# Patient Record
Sex: Male | Born: 1955 | Race: White | Hispanic: No | Marital: Married | Smoking: Never smoker
Health system: Southern US, Community
[De-identification: ages and names within clinical notes are randomized; demographics above are authoritative.]

## PROBLEM LIST (undated history)

## (undated) DIAGNOSIS — I10 Essential (primary) hypertension: Secondary | ICD-10-CM

## (undated) DIAGNOSIS — E119 Type 2 diabetes mellitus without complications: Secondary | ICD-10-CM

## (undated) HISTORY — DX: Type 2 diabetes mellitus without complications: E11.9

## (undated) HISTORY — DX: Essential (primary) hypertension: I10

---

## 2013-03-21 ENCOUNTER — Ambulatory Visit (INDEPENDENT_AMBULATORY_CARE_PROVIDER_SITE_OTHER): Payer: Managed Care, Other (non HMO)

## 2013-03-21 VITALS — BP 145/82 | HR 86 | Resp 18

## 2013-03-21 DIAGNOSIS — M722 Plantar fascial fibromatosis: Secondary | ICD-10-CM

## 2013-03-21 DIAGNOSIS — I739 Peripheral vascular disease, unspecified: Secondary | ICD-10-CM

## 2013-03-21 DIAGNOSIS — M79609 Pain in unspecified limb: Secondary | ICD-10-CM

## 2013-03-21 DIAGNOSIS — E1142 Type 2 diabetes mellitus with diabetic polyneuropathy: Secondary | ICD-10-CM

## 2013-03-21 DIAGNOSIS — E1140 Type 2 diabetes mellitus with diabetic neuropathy, unspecified: Secondary | ICD-10-CM

## 2013-03-21 DIAGNOSIS — E1149 Type 2 diabetes mellitus with other diabetic neurological complication: Secondary | ICD-10-CM

## 2013-03-21 MED ORDER — MELOXICAM 15 MG PO TABS: 15.0000 mg | ORAL_TABLET | Freq: Every day | ORAL | Status: DC
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
## 2013-03-21 NOTE — Patient Instructions (Signed)
Plantar Fasciitis Plantar fasciitis is a common condition that causes foot pain. It is soreness (inflammation) of the band of tough fibrous tissue on the bottom of the foot that runs from the heel bone (calcaneus) to the ball of the foot. The cause of this soreness may be from excessive standing, poor fitting shoes, running on hard surfaces, being overweight, having an abnormal walk, or overuse (this is common in runners) of the painful foot or feet. It is also common in aerobic exercise dancers and ballet dancers. SYMPTOMS  Most people with plantar fasciitis complain of:  Severe pain in the morning on the bottom of their foot especially when taking the first steps out of bed. This pain recedes after a few minutes of walking.  Severe pain is experienced also during walking following a long period of inactivity.  Pain is worse when walking barefoot or up stairs DIAGNOSIS   Your caregiver will diagnose this condition by examining and feeling your foot.  Special tests such as X-rays of your foot, are usually not needed. PREVENTION   Consult a sports medicine professional before beginning a new exercise program.  Walking programs offer a good workout. With walking there is a lower chance of overuse injuries common to runners. There is less impact and less jarring of the joints.  Begin all new exercise programs slowly. If problems or pain develop, decrease the amount of time or distance until you are at a comfortable level.  Wear good shoes and replace them regularly.  Stretch your foot and the heel cords at the back of the ankle (Achilles tendon) both before and after exercise.  Run or exercise on even surfaces that are not hard. For example, asphalt is better than pavement.  Do not run barefoot on hard surfaces.  If using a treadmill, vary the incline.  Do not continue to workout if you have foot or joint problems. Seek professional help if they do not improve. HOME CARE INSTRUCTIONS     Avoid activities that cause you pain until you recover.  Use ice or cold packs on the problem or painful areas after working out.  Only take over-the-counter or prescription medicines for pain, discomfort, or fever as directed by your caregiver.  For shoe inserts or athletic shoes with air or a firm shoe and arch support may be helpful.  If problems continue or become more severe, consult a sports medicine caregiver or your own health care provider. Cortisone is a potent anti-inflammatory medication that may be injected into the painful area. You can discuss this treatment with your caregiver. MAKE SURE YOU:   Understand these instructions.  Will watch your condition.  Will get help right away if you are not doing well or get worse. Document Released: 11/09/2000 Document Revised: 05/09/2011 Document Reviewed: 01/09/2008 [facility] Patient Information 2014 [place], [place].    ICE INSTRUCTIONS  Apply ice or cold pack to the affected area at least 3 times a day for 10-15 minutes each time.  You should also use ice after prolonged activity or vigorous exercise.  Do not apply ice longer than 20 minutes at one time.  Always keep a cloth between your skin and the ice pack to prevent burns.  Being consistent and following these instructions will help control your symptoms.  We suggest you purchase a gel ice pack because they are reusable and do bit leak.  Some of them are designed to wrap around the area.  Use the method that works best for you.  Here are some other suggestions for icing.   Use a frozen bag of peas or corn-inexpensive and molds well to your body, usually stays frozen for 10 to 20 minutes.  Wet a towel with cold water and squeeze out the excess until it's damp.  Place in a bag in the freezer for 20 minutes. Then remove and use.

## 2013-04-11 ENCOUNTER — Ambulatory Visit (INDEPENDENT_AMBULATORY_CARE_PROVIDER_SITE_OTHER): Payer: Managed Care, Other (non HMO)

## 2013-04-11 VITALS — BP 139/85 | HR 84 | Resp 18

## 2013-04-11 DIAGNOSIS — E1142 Type 2 diabetes mellitus with diabetic polyneuropathy: Secondary | ICD-10-CM

## 2013-04-11 DIAGNOSIS — M722 Plantar fascial fibromatosis: Secondary | ICD-10-CM

## 2013-04-11 DIAGNOSIS — E1140 Type 2 diabetes mellitus with diabetic neuropathy, unspecified: Secondary | ICD-10-CM

## 2013-04-11 DIAGNOSIS — M79609 Pain in unspecified limb: Secondary | ICD-10-CM

## 2013-04-11 DIAGNOSIS — E1149 Type 2 diabetes mellitus with other diabetic neurological complication: Secondary | ICD-10-CM

## 2013-04-11 NOTE — Progress Notes (Signed)
   Subjective:    Patient ID: Anthony RaringRobert Hines, male    DOB: [birth year], 58 y.o.   MRN: [id number]  HPI My right heel was better and now it is hurting again and been going on since last Friday and I cant put pressure on it    Review of Systems no new changes or findings.    Objective:   Physical Exam Neurovascular status is intact pedal pulses palpable epicritic and proprioceptive sensations intact and symmetric bilateral although diminished on Semmes Weinstein testing. The fascia taping help significantly well was in place and within for 5 days pain recurred once again. Patient is having some improvement with his burning sensation for his diabetes with gabapentin medication. Advised to continue with his current medications meloxicam gabapentin as instructed patient is a strong candidate for functional orthoses based on progress with taping. Has worn over-the-counter insoles with minimal improvement or success therefore we'll proceed to functional orthoses at this time       Assessment & Plan:  Assessment plantar fasciitis/heel spur syndrome as well as mild diabetic neuropathy possibly contributing to the pain. Plan at this time fascial strapping reapplied today maintain his current medications and followup for her orthotic casting with him next week for functional orthoses.  Alvan Dameichard Lora Glomski DPM

## 2013-04-11 NOTE — Patient Instructions (Signed)

## 2013-10-26 ENCOUNTER — Emergency Department (HOSPITAL_COMMUNITY): Payer: Managed Care, Other (non HMO)

## 2013-10-26 ENCOUNTER — Inpatient Hospital Stay (HOSPITAL_COMMUNITY)
Admission: EM | Admit: 2013-10-26 | Discharge: 2013-10-30 | DRG: 957 | Disposition: A | Discharge status: Rehabilitation Facility | Payer: Managed Care, Other (non HMO) | Attending: General Surgery | Admitting: General Surgery

## 2013-10-26 ENCOUNTER — Encounter (HOSPITAL_COMMUNITY): Payer: Managed Care, Other (non HMO) | Admitting: Anesthesiology

## 2013-10-26 ENCOUNTER — Inpatient Hospital Stay (HOSPITAL_COMMUNITY): Payer: Managed Care, Other (non HMO)

## 2013-10-26 ENCOUNTER — Emergency Department (HOSPITAL_COMMUNITY): Payer: Managed Care, Other (non HMO) | Admitting: Anesthesiology

## 2013-10-26 ENCOUNTER — Encounter (HOSPITAL_COMMUNITY): Admission: EM | Disposition: A | Discharge status: Rehabilitation Facility | Payer: Self-pay | Source: Home / Self Care

## 2013-10-26 ENCOUNTER — Encounter (HOSPITAL_COMMUNITY): Payer: Self-pay | Admitting: Radiology

## 2013-10-26 DIAGNOSIS — M25519 Pain in unspecified shoulder: Secondary | ICD-10-CM | POA: Diagnosis present

## 2013-10-26 DIAGNOSIS — D62 Acute posthemorrhagic anemia: Secondary | ICD-10-CM | POA: Diagnosis not present

## 2013-10-26 DIAGNOSIS — S2239XA Fracture of one rib, unspecified side, initial encounter for closed fracture: Secondary | ICD-10-CM | POA: Diagnosis present

## 2013-10-26 DIAGNOSIS — IMO0002 Reserved for concepts with insufficient information to code with codable children: Secondary | ICD-10-CM | POA: Diagnosis present

## 2013-10-26 DIAGNOSIS — S3210XA Unspecified fracture of sacrum, initial encounter for closed fracture: Secondary | ICD-10-CM | POA: Diagnosis present

## 2013-10-26 DIAGNOSIS — N170 Acute kidney failure with tubular necrosis: Secondary | ICD-10-CM | POA: Diagnosis not present

## 2013-10-26 DIAGNOSIS — I10 Essential (primary) hypertension: Secondary | ICD-10-CM | POA: Diagnosis present

## 2013-10-26 DIAGNOSIS — S322XXA Fracture of coccyx, initial encounter for closed fracture: Secondary | ICD-10-CM

## 2013-10-26 DIAGNOSIS — T50995A Adverse effect of other drugs, medicaments and biological substances, initial encounter: Secondary | ICD-10-CM | POA: Diagnosis not present

## 2013-10-26 DIAGNOSIS — S36899A Unspecified injury of other intra-abdominal organs, initial encounter: Secondary | ICD-10-CM | POA: Diagnosis present

## 2013-10-26 DIAGNOSIS — E119 Type 2 diabetes mellitus without complications: Secondary | ICD-10-CM | POA: Diagnosis present

## 2013-10-26 DIAGNOSIS — S22089A Unspecified fracture of T11-T12 vertebra, initial encounter for closed fracture: Secondary | ICD-10-CM | POA: Diagnosis present

## 2013-10-26 DIAGNOSIS — S22009A Unspecified fracture of unspecified thoracic vertebra, initial encounter for closed fracture: Secondary | ICD-10-CM | POA: Diagnosis present

## 2013-10-26 DIAGNOSIS — E875 Hyperkalemia: Secondary | ICD-10-CM | POA: Diagnosis present

## 2013-10-26 DIAGNOSIS — S37032A Laceration of left kidney, unspecified degree, initial encounter: Secondary | ICD-10-CM | POA: Diagnosis present

## 2013-10-26 DIAGNOSIS — K560 Paralytic ileus: Secondary | ICD-10-CM | POA: Diagnosis present

## 2013-10-26 DIAGNOSIS — S37009A Unspecified injury of unspecified kidney, initial encounter: Secondary | ICD-10-CM

## 2013-10-26 DIAGNOSIS — S37039A Laceration of unspecified kidney, unspecified degree, initial encounter: Secondary | ICD-10-CM | POA: Diagnosis present

## 2013-10-26 DIAGNOSIS — R031 Nonspecific low blood-pressure reading: Secondary | ICD-10-CM | POA: Diagnosis present

## 2013-10-26 DIAGNOSIS — I959 Hypotension, unspecified: Secondary | ICD-10-CM | POA: Diagnosis present

## 2013-10-26 DIAGNOSIS — Z5189 Encounter for other specified aftercare: Secondary | ICD-10-CM | POA: Diagnosis not present

## 2013-10-26 DIAGNOSIS — T07XXXA Unspecified multiple injuries, initial encounter: Secondary | ICD-10-CM | POA: Diagnosis present

## 2013-10-26 DIAGNOSIS — S2232XA Fracture of one rib, left side, initial encounter for closed fracture: Secondary | ICD-10-CM | POA: Diagnosis present

## 2013-10-26 HISTORY — PX: POSTERIOR LUMBAR FUSION 4 LEVEL: SHX6037

## 2013-10-26 LAB — CBC WITH DIFFERENTIAL/PLATELET
BASOS ABS: 0 10*3/uL (ref 0.0–0.1)
Basophils Relative: 0 % (ref 0–1)
Eosinophils Absolute: 0.2 10*3/uL (ref 0.0–0.7)
Eosinophils Relative: 1 % (ref 0–5)
HCT: 33.7 % — ABNORMAL LOW (ref 39.0–52.0)
Hemoglobin: 11.8 g/dL — ABNORMAL LOW (ref 13.0–17.0)
LYMPHS ABS: 5.1 10*3/uL — AB (ref 0.7–4.0)
Lymphocytes Relative: 23 % (ref 12–46)
MCH: 32.8 pg (ref 26.0–34.0)
MCHC: 35 g/dL (ref 30.0–36.0)
MCV: 93.6 fL (ref 78.0–100.0)
MONO ABS: 0.7 10*3/uL (ref 0.1–1.0)
MONOS PCT: 3 % (ref 3–12)
Neutro Abs: 16 10*3/uL — ABNORMAL HIGH (ref 1.7–7.7)
Neutrophils Relative %: 73 % (ref 43–77)
Platelets: 153 10*3/uL (ref 150–400)
RBC: 3.6 MIL/uL — ABNORMAL LOW (ref 4.22–5.81)
RDW: 12.5 % (ref 11.5–15.5)
WBC MORPHOLOGY: INCREASED
WBC: 22 10*3/uL — AB (ref 4.0–10.5)

## 2013-10-26 LAB — I-STAT CHEM 8, ED
BUN: 31 mg/dL — ABNORMAL HIGH (ref 6–23)
CHLORIDE: 103 meq/L (ref 96–112)
Calcium, Ion: 1.12 mmol/L (ref 1.12–1.23)
Creatinine, Ser: 1.6 mg/dL — ABNORMAL HIGH (ref 0.50–1.35)
Glucose, Bld: 287 mg/dL — ABNORMAL HIGH (ref 70–99)
HCT: 37 % — ABNORMAL LOW (ref 39.0–52.0)
Hemoglobin: 12.6 g/dL — ABNORMAL LOW (ref 13.0–17.0)
Potassium: 4.2 mEq/L (ref 3.7–5.3)
SODIUM: 137 meq/L (ref 137–147)
TCO2: 26 mmol/L (ref 0–100)

## 2013-10-26 LAB — PREPARE RBC (CROSSMATCH)

## 2013-10-26 LAB — COMPREHENSIVE METABOLIC PANEL
ALBUMIN: 3.4 g/dL — AB (ref 3.5–5.2)
ALT: 98 U/L — ABNORMAL HIGH (ref 0–53)
AST: 129 U/L — ABNORMAL HIGH (ref 0–37)
Alkaline Phosphatase: 58 U/L (ref 39–117)
Anion gap: 17 — ABNORMAL HIGH (ref 5–15)
BUN: 23 mg/dL (ref 6–23)
CO2: 22 mEq/L (ref 19–32)
CREATININE: 1.44 mg/dL — AB (ref 0.50–1.35)
Calcium: 8.4 mg/dL (ref 8.4–10.5)
Chloride: 99 mEq/L (ref 96–112)
GFR calc Af Amer: 61 mL/min — ABNORMAL LOW (ref >90)
GFR calc non Af Amer: 53 mL/min — ABNORMAL LOW (ref >90)
Glucose, Bld: 277 mg/dL — ABNORMAL HIGH (ref 70–99)
Potassium: 4.4 mEq/L (ref 3.7–5.3)
Sodium: 138 mEq/L (ref 137–147)
TOTAL PROTEIN: 7 g/dL (ref 6.0–8.3)
Total Bilirubin: 0.3 mg/dL (ref 0.3–1.2)

## 2013-10-26 LAB — CBC
HCT: 28 % — ABNORMAL LOW (ref 39.0–52.0)
HEMOGLOBIN: 9.7 g/dL — AB (ref 13.0–17.0)
MCH: 32.1 pg (ref 26.0–34.0)
MCHC: 34.6 g/dL (ref 30.0–36.0)
MCV: 92.7 fL (ref 78.0–100.0)
Platelets: 166 10*3/uL (ref 150–400)
RBC: 3.02 MIL/uL — AB (ref 4.22–5.81)
RDW: 13.7 % (ref 11.5–15.5)
WBC: 13.6 10*3/uL — AB (ref 4.0–10.5)

## 2013-10-26 LAB — MRSA PCR SCREENING: MRSA by PCR: NEGATIVE

## 2013-10-26 LAB — AMYLASE: Amylase: 117 U/L — ABNORMAL HIGH (ref 0–105)

## 2013-10-26 LAB — ABO/RH: ABO/RH(D): O POS

## 2013-10-26 SURGERY — POSTERIOR LUMBAR FUSION 4 LEVEL
Anesthesia: General

## 2013-10-26 MED ORDER — MORPHINE SULFATE 2 MG/ML IJ SOLN
INTRAMUSCULAR | Status: AC
  Administered 2013-10-26: 4 mg via INTRAVENOUS
  Filled 2013-10-26: qty 2

## 2013-10-26 MED ORDER — ONDANSETRON HCL 4 MG/2ML IJ SOLN
4.0000 mg | Freq: Four times a day (QID) | INTRAMUSCULAR | Status: DC | PRN
  Administered 2013-10-27 (×2): 4 mg via INTRAVENOUS
  Filled 2013-10-26 (×2): qty 2

## 2013-10-26 MED ORDER — SODIUM CHLORIDE 0.9 % IR SOLN
Status: DC | PRN
  Administered 2013-10-26: 16:00:00

## 2013-10-26 MED ORDER — POTASSIUM CHLORIDE IN NACL 20-0.45 MEQ/L-% IV SOLN
INTRAVENOUS | Status: DC
  Administered 2013-10-26: 75 mL/h via INTRAVENOUS
  Filled 2013-10-26 (×4): qty 1000

## 2013-10-26 MED ORDER — FENTANYL CITRATE 0.05 MG/ML IJ SOLN
INTRAMUSCULAR | Status: DC | PRN
  Administered 2013-10-26 (×3): 50 ug via INTRAVENOUS
  Administered 2013-10-26: 100 ug via INTRAVENOUS

## 2013-10-26 MED ORDER — GABAPENTIN 300 MG PO CAPS
300.0000 mg | ORAL_CAPSULE | Freq: Two times a day (BID) | ORAL | Status: DC
  Administered 2013-10-26 – 2013-10-30 (×8): 300 mg via ORAL
  Filled 2013-10-26 (×13): qty 1

## 2013-10-26 MED ORDER — AMLODIPINE BESYLATE 5 MG PO TABS
5.0000 mg | ORAL_TABLET | Freq: Every day | ORAL | Status: DC
  Filled 2013-10-26: qty 1

## 2013-10-26 MED ORDER — THROMBIN 20000 UNITS EX SOLR
CUTANEOUS | Status: DC | PRN
  Administered 2013-10-26: 16:00:00 via TOPICAL

## 2013-10-26 MED ORDER — HYDROCHLOROTHIAZIDE 25 MG PO TABS
25.0000 mg | ORAL_TABLET | Freq: Every day | ORAL | Status: DC
  Filled 2013-10-26: qty 1

## 2013-10-26 MED ORDER — LACTATED RINGERS IV SOLN
INTRAVENOUS | Status: DC | PRN
  Administered 2013-10-26 (×4): via INTRAVENOUS

## 2013-10-26 MED ORDER — LISINOPRIL-HYDROCHLOROTHIAZIDE 20-25 MG PO TABS: 1.0000 | ORAL_TABLET | Freq: Every day | ORAL | Status: DC

## 2013-10-26 MED ORDER — DOCUSATE SODIUM 100 MG PO CAPS
100.0000 mg | ORAL_CAPSULE | Freq: Two times a day (BID) | ORAL | Status: DC
  Administered 2013-10-26 – 2013-10-30 (×8): 100 mg via ORAL
  Filled 2013-10-26 (×12): qty 1

## 2013-10-26 MED ORDER — LIDOCAINE HCL (CARDIAC) 20 MG/ML IV SOLN
INTRAVENOUS | Status: DC | PRN
  Administered 2013-10-26: 60 mg via INTRAVENOUS

## 2013-10-26 MED ORDER — MORPHINE SULFATE 2 MG/ML IJ SOLN
INTRAMUSCULAR | Status: AC
  Filled 2013-10-26: qty 1

## 2013-10-26 MED ORDER — IOHEXOL 300 MG/ML  SOLN
100.0000 mL | Freq: Once | INTRAMUSCULAR | Status: AC | PRN
  Administered 2013-10-26: 100 mL via INTRAVENOUS

## 2013-10-26 MED ORDER — SUCCINYLCHOLINE CHLORIDE 20 MG/ML IJ SOLN
INTRAMUSCULAR | Status: DC | PRN
  Administered 2013-10-26: 120 mg via INTRAVENOUS

## 2013-10-26 MED ORDER — ONDANSETRON HCL 4 MG/2ML IJ SOLN
INTRAMUSCULAR | Status: DC | PRN
  Administered 2013-10-26: 4 mg via INTRAVENOUS

## 2013-10-26 MED ORDER — SODIUM CHLORIDE 0.9 % IV SOLN
INTRAVENOUS | Status: DC
  Administered 2013-10-26: 4.7 [IU]/h via INTRAVENOUS
  Filled 2013-10-26: qty 2.5

## 2013-10-26 MED ORDER — GLYCOPYRROLATE 0.2 MG/ML IJ SOLN
INTRAMUSCULAR | Status: DC | PRN
  Administered 2013-10-26: 0.4 mg via INTRAVENOUS

## 2013-10-26 MED ORDER — DEXAMETHASONE SODIUM PHOSPHATE 4 MG/ML IJ SOLN
INTRAMUSCULAR | Status: DC | PRN
  Administered 2013-10-26: 4 mg via INTRAVENOUS

## 2013-10-26 MED ORDER — 0.9 % SODIUM CHLORIDE (POUR BTL) OPTIME
TOPICAL | Status: DC | PRN
  Administered 2013-10-26: 1000 mL

## 2013-10-26 MED ORDER — PROPOFOL 10 MG/ML IV BOLUS
INTRAVENOUS | Status: AC
  Filled 2013-10-26: qty 20

## 2013-10-26 MED ORDER — PHENYLEPHRINE HCL 10 MG/ML IJ SOLN: 10.0000 mg | INTRAVENOUS | Status: DC | PRN

## 2013-10-26 MED ORDER — LISINOPRIL 20 MG PO TABS
20.0000 mg | ORAL_TABLET | Freq: Every day | ORAL | Status: DC
  Filled 2013-10-26: qty 1

## 2013-10-26 MED ORDER — VECURONIUM BROMIDE 10 MG IV SOLR
INTRAVENOUS | Status: DC | PRN
  Administered 2013-10-26: 10 mg via INTRAVENOUS

## 2013-10-26 MED ORDER — PROPOFOL 10 MG/ML IV BOLUS
INTRAVENOUS | Status: DC | PRN
  Administered 2013-10-26: 100 mg via INTRAVENOUS
  Administered 2013-10-26: 150 mg via INTRAVENOUS

## 2013-10-26 MED ORDER — FENTANYL CITRATE 0.05 MG/ML IJ SOLN
INTRAMUSCULAR | Status: AC
  Filled 2013-10-26: qty 5

## 2013-10-26 MED ORDER — SILVER SULFADIAZINE 1 % EX CREA
TOPICAL_CREAM | Freq: Two times a day (BID) | CUTANEOUS | Status: DC
  Administered 2013-10-26: 1 via TOPICAL
  Administered 2013-10-27 – 2013-10-29 (×5): via TOPICAL
  Administered 2013-10-29: 1 via TOPICAL
  Administered 2013-10-30: 10:00:00 via TOPICAL
  Filled 2013-10-26 (×3): qty 85

## 2013-10-26 MED ORDER — INSULIN ASPART 100 UNIT/ML ~~LOC~~ SOLN
0.0000 [IU] | Freq: Three times a day (TID) | SUBCUTANEOUS | Status: DC
  Administered 2013-10-27 – 2013-10-28 (×2): 4 [IU] via SUBCUTANEOUS
  Administered 2013-10-28 (×2): 3 [IU] via SUBCUTANEOUS
  Administered 2013-10-29 (×2): 4 [IU] via SUBCUTANEOUS

## 2013-10-26 MED ORDER — SODIUM CHLORIDE 0.9 % IV SOLN
INTRAVENOUS | Status: DC | PRN
  Administered 2013-10-26 (×3): via INTRAVENOUS

## 2013-10-26 MED ORDER — CEFAZOLIN SODIUM-DEXTROSE 2-3 GM-% IV SOLR
INTRAVENOUS | Status: AC
  Administered 2013-10-26: 2 g via INTRAVENOUS
  Filled 2013-10-26: qty 50

## 2013-10-26 MED ORDER — LIDOCAINE HCL (CARDIAC) 20 MG/ML IV SOLN
INTRAVENOUS | Status: AC
  Filled 2013-10-26: qty 5

## 2013-10-26 MED ORDER — ONDANSETRON HCL 4 MG/2ML IJ SOLN
INTRAMUSCULAR | Status: AC
  Filled 2013-10-26: qty 2

## 2013-10-26 MED ORDER — OXYCODONE HCL 5 MG PO TABS
10.0000 mg | ORAL_TABLET | ORAL | Status: DC | PRN
  Administered 2013-10-27: 10 mg via ORAL
  Filled 2013-10-26: qty 2

## 2013-10-26 MED ORDER — POLYETHYLENE GLYCOL 3350 17 G PO PACK
17.0000 g | PACK | Freq: Every day | ORAL | Status: DC
Start: 2013-10-27 — End: 2013-10-30
  Administered 2013-10-27 – 2013-10-30 (×4): 17 g via ORAL
  Filled 2013-10-26 (×5): qty 1

## 2013-10-26 MED ORDER — NEOSTIGMINE METHYLSULFATE 10 MG/10ML IV SOLN
INTRAVENOUS | Status: DC | PRN
  Administered 2013-10-26: 3 mg via INTRAVENOUS

## 2013-10-26 MED ORDER — ONDANSETRON HCL 4 MG PO TABS: 4.0000 mg | ORAL_TABLET | Freq: Four times a day (QID) | ORAL | Status: DC | PRN

## 2013-10-26 MED ORDER — GLIMEPIRIDE 2 MG PO TABS
2.0000 mg | ORAL_TABLET | Freq: Every day | ORAL | Status: DC
  Filled 2013-10-26 (×3): qty 1

## 2013-10-26 MED ORDER — SUCCINYLCHOLINE CHLORIDE 20 MG/ML IJ SOLN
INTRAMUSCULAR | Status: AC
  Filled 2013-10-26: qty 1

## 2013-10-26 MED ORDER — PHENYLEPHRINE HCL 10 MG/ML IJ SOLN
10.0000 mg | INTRAVENOUS | Status: DC | PRN
  Administered 2013-10-26: 150 ug/min via INTRAVENOUS

## 2013-10-26 MED ORDER — HYDROMORPHONE HCL PF 1 MG/ML IJ SOLN
INTRAMUSCULAR | Status: AC
  Filled 2013-10-26: qty 1

## 2013-10-26 MED ORDER — HYDROMORPHONE HCL PF 1 MG/ML IJ SOLN
0.2500 mg | INTRAMUSCULAR | Status: DC | PRN
  Administered 2013-10-26 (×2): 0.5 mg via INTRAVENOUS

## 2013-10-26 MED ORDER — ALBUMIN HUMAN 5 % IV SOLN
INTRAVENOUS | Status: DC | PRN
  Administered 2013-10-26 (×2): via INTRAVENOUS

## 2013-10-26 MED ORDER — PANTOPRAZOLE SODIUM 40 MG PO TBEC: 40.0000 mg | DELAYED_RELEASE_TABLET | Freq: Every day | ORAL | Status: DC

## 2013-10-26 MED ORDER — ONDANSETRON HCL 4 MG/2ML IJ SOLN
INTRAMUSCULAR | Status: AC
  Administered 2013-10-26: 4 mg
  Filled 2013-10-26: qty 2

## 2013-10-26 MED ORDER — ATORVASTATIN CALCIUM 10 MG PO TABS
10.0000 mg | ORAL_TABLET | Freq: Every day | ORAL | Status: DC
  Administered 2013-10-26 – 2013-10-30 (×5): 10 mg via ORAL
  Filled 2013-10-26 (×7): qty 1

## 2013-10-26 MED ORDER — PANTOPRAZOLE SODIUM 40 MG IV SOLR
40.0000 mg | Freq: Every day | INTRAVENOUS | Status: DC
  Filled 2013-10-26: qty 40

## 2013-10-26 MED ORDER — MORPHINE SULFATE 2 MG/ML IJ SOLN
2.0000 mg | INTRAMUSCULAR | Status: DC | PRN
  Administered 2013-10-27 – 2013-10-30 (×7): 2 mg via INTRAVENOUS
  Filled 2013-10-26 (×7): qty 1

## 2013-10-26 SURGICAL SUPPLY — 72 items
BAG DECANTER FOR FLEXI CONT (MISCELLANEOUS) ×3 IMPLANT
BENZOIN TINCTURE PRP APPL 2/3 (GAUZE/BANDAGES/DRESSINGS) IMPLANT
BLADE SURG 11 STRL SS (BLADE) ×3 IMPLANT
BLADE SURG ROTATE 9660 (MISCELLANEOUS) ×3 IMPLANT
BUR MATCHSTICK NEURO 3.0 LAGG (BURR) IMPLANT
CANISTER SUCT 3000ML (MISCELLANEOUS) ×3 IMPLANT
CATH FOLEY 2WAY SLVR  5CC 14FR (CATHETERS)
CATH FOLEY 2WAY SLVR 5CC 14FR (CATHETERS) IMPLANT
CLOSURE WOUND 1/2 X4 (GAUZE/BANDAGES/DRESSINGS)
CONT SPEC 4OZ CLIKSEAL STRL BL (MISCELLANEOUS) ×3 IMPLANT
COVER BACK TABLE 24X17X13 BIG (DRAPES) IMPLANT
COVER TABLE BACK 60X90 (DRAPES) ×3 IMPLANT
DECANTER SPIKE VIAL GLASS SM (MISCELLANEOUS) ×3 IMPLANT
DERMABOND ADVANCED (GAUZE/BANDAGES/DRESSINGS) ×4
DERMABOND ADVANCED .7 DNX12 (GAUZE/BANDAGES/DRESSINGS) ×2 IMPLANT
DRAPE C-ARM 42X72 X-RAY (DRAPES) ×3 IMPLANT
DRAPE C-ARMOR (DRAPES) ×3 IMPLANT
DRAPE LAPAROTOMY 100X72X124 (DRAPES) ×3 IMPLANT
DRAPE MICROSCOPE LEICA (MISCELLANEOUS) IMPLANT
DRAPE POUCH INSTRU U-SHP 10X18 (DRAPES) ×3 IMPLANT
DRAPE SURG 17X23 STRL (DRAPES) IMPLANT
DRSG OPSITE POSTOP 4X10 (GAUZE/BANDAGES/DRESSINGS) ×3 IMPLANT
DURAPREP 26ML APPLICATOR (WOUND CARE) ×3 IMPLANT
ELECT REM PT RETURN 9FT ADLT (ELECTROSURGICAL) ×3
ELECTRODE REM PT RTRN 9FT ADLT (ELECTROSURGICAL) ×1 IMPLANT
GAUZE SPONGE 4X4 12PLY STRL (GAUZE/BANDAGES/DRESSINGS) IMPLANT
GAUZE SPONGE 4X4 16PLY XRAY LF (GAUZE/BANDAGES/DRESSINGS) IMPLANT
GLOVE BIOGEL PI IND STRL 7.5 (GLOVE) ×1 IMPLANT
GLOVE BIOGEL PI IND STRL 8 (GLOVE) ×2 IMPLANT
GLOVE BIOGEL PI INDICATOR 7.5 (GLOVE) ×2
GLOVE BIOGEL PI INDICATOR 8 (GLOVE) ×4
GLOVE ECLIPSE 7.0 STRL STRAW (GLOVE) ×3 IMPLANT
GLOVE ECLIPSE 7.5 STRL STRAW (GLOVE) ×9 IMPLANT
GLOVE EXAM NITRILE LRG STRL (GLOVE) IMPLANT
GLOVE EXAM NITRILE MD LF STRL (GLOVE) ×3 IMPLANT
GLOVE EXAM NITRILE XL STR (GLOVE) IMPLANT
GLOVE EXAM NITRILE XS STR PU (GLOVE) IMPLANT
GOWN STRL REUS W/ TWL LRG LVL3 (GOWN DISPOSABLE) ×1 IMPLANT
GOWN STRL REUS W/ TWL XL LVL3 (GOWN DISPOSABLE) IMPLANT
GOWN STRL REUS W/TWL 2XL LVL3 (GOWN DISPOSABLE) ×6 IMPLANT
GOWN STRL REUS W/TWL LRG LVL3 (GOWN DISPOSABLE) ×2
GOWN STRL REUS W/TWL XL LVL3 (GOWN DISPOSABLE)
GUIDEWIRE NITINOL BEVEL TIP (WIRE) ×24 IMPLANT
HEMOSTAT POWDER KIT SURGIFOAM (HEMOSTASIS) IMPLANT
KIT BASIN OR (CUSTOM PROCEDURE TRAY) ×3 IMPLANT
KIT ROOM TURNOVER OR (KITS) ×3 IMPLANT
NEEDLE ASPIRATION RAN815N 8X15 (NEEDLE) ×4 IMPLANT
NEEDLE ASPIRATION RANFAC 8X15 (NEEDLE) ×8
NEEDLE HYPO 18GX1.5 BLUNT FILL (NEEDLE) IMPLANT
NEEDLE HYPO 25X1 1.5 SAFETY (NEEDLE) ×3 IMPLANT
NEEDLE SPNL 18GX3.5 QUINCKE PK (NEEDLE) IMPLANT
NS IRRIG 1000ML POUR BTL (IV SOLUTION) ×3 IMPLANT
PACK LAMINECTOMY NEURO (CUSTOM PROCEDURE TRAY) ×3 IMPLANT
PAD ARMBOARD 7.5X6 YLW CONV (MISCELLANEOUS) ×9 IMPLANT
ROD RELINE MAS ST 5.5X300MM (Rod) ×6 IMPLANT
RUBBERBAND STERILE (MISCELLANEOUS) IMPLANT
SCREW LOCK RELINE 5.5 TULIP (Screw) ×24 IMPLANT
SCREW MAS RELINE 6.5X45 POLY (Screw) ×6 IMPLANT
SCREW RELINE MAS POLY 5.5X45MM (Screw) ×18 IMPLANT
SPONGE LAP 4X18 X RAY DECT (DISPOSABLE) IMPLANT
SPONGE SURGIFOAM ABS GEL 100 (HEMOSTASIS) ×3 IMPLANT
STRIP CLOSURE SKIN 1/2X4 (GAUZE/BANDAGES/DRESSINGS) IMPLANT
SUT VIC AB 0 CT1 18XCR BRD8 (SUTURE) ×2 IMPLANT
SUT VIC AB 0 CT1 8-18 (SUTURE) ×4
SUT VIC AB 2-0 CT1 18 (SUTURE) ×3 IMPLANT
SUT VICRYL 3-0 RB1 18 ABS (SUTURE) ×15 IMPLANT
SYR 20ML ECCENTRIC (SYRINGE) ×3 IMPLANT
SYR 3ML LL SCALE MARK (SYRINGE) IMPLANT
TOWEL OR 17X24 6PK STRL BLUE (TOWEL DISPOSABLE) ×3 IMPLANT
TOWEL OR 17X26 10 PK STRL BLUE (TOWEL DISPOSABLE) ×3 IMPLANT
TRAP SPECIMEN MUCOUS 40CC (MISCELLANEOUS) ×3 IMPLANT
WATER STERILE IRR 1000ML POUR (IV SOLUTION) ×3 IMPLANT

## 2013-10-26 NOTE — ED Notes (Signed)
Paged Dr. Conchita Paris to [phone]

## 2013-10-26 NOTE — Anesthesia Preprocedure Evaluation (Addendum)
Anesthesia Evaluation  Patient identified by MRN, date of birth, ID band Patient awake    Reviewed: Allergy & Precautions, H&P , NPO status , Patient's Chart, lab work & pertinent test results  Airway Mallampati: II TM Distance: <3 FB   Mouth opening: Limited Mouth Opening Comment: Hard C-collar on. Dr. Conchita Paris examined patient's neck  in preop holding and cleared cervical spine. Informed team that we could take collar off with intubation and case.  CE Dental   Pulmonary neg pulmonary ROS,          Cardiovascular hypertension,     Neuro/Psych    GI/Hepatic negative GI ROS, Neg liver ROS,   Endo/Other  diabetes  Renal/GU negative Renal ROS     Musculoskeletal   Abdominal   Peds  Hematology   Anesthesia Other Findings   Reproductive/Obstetrics                          Anesthesia Physical Anesthesia Plan  ASA: III  Anesthesia Plan: General   Post-op Pain Management:    Induction: Intravenous, Rapid sequence and Cricoid pressure planned  Airway Management Planned: Oral ETT  Additional Equipment:   Intra-op Plan:   Post-operative Plan: Possible Post-op intubation/ventilation  Informed Consent: I have reviewed the patients History and Physical, chart, labs and discussed the procedure including the risks, benefits and alternatives for the proposed anesthesia with the patient or authorized representative who has indicated his/her understanding and acceptance.   Dental advisory given  Plan Discussed with: CRNA, Anesthesiologist and Surgeon  Anesthesia Plan Comments:         Anesthesia Quick Evaluation

## 2013-10-26 NOTE — Progress Notes (Signed)
Late entry; D; verified pt's position with Dr. Conchita Paris, can up to 45 degree and he ordered TLSO brace, can use when pt is up.     Report given to Stacy,S.RN.

## 2013-10-26 NOTE — ED Notes (Signed)
Placed on 2L [place] for sats of 91%.
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
## 2013-10-26 NOTE — Anesthesia Procedure Notes (Signed)
Procedure Name: Intubation Date/Time: 10/26/2013 4:21 PM Performed by: Anthony Hines A Pre-anesthesia Checklist: Patient identified, Timeout performed, Emergency Drugs available, Suction available and Patient being monitored Patient Re-evaluated:Patient Re-evaluated prior to inductionOxygen Delivery Method: Circle system utilized Preoxygenation: Pre-oxygenation with 100% oxygen Intubation Type: IV induction, Rapid sequence and Cricoid Pressure applied Tube type: Oral Number of attempts: 1 Airway Equipment and Method: Video-laryngoscopy Placement Confirmation: ETT inserted through vocal cords under direct vision,  breath sounds checked- equal and bilateral and positive ETCO2 Secured at: 21 cm Tube secured with: Tape Dental Injury: Teeth and Oropharynx as per pre-operative assessment  Comments: Head and neck neutral throughout.  C-Collar off per Dr. Conchita Paris.
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
## 2013-10-26 NOTE — ED Notes (Addendum)
Dr Conchita Paris (neurosurgery) at bedside speaking with pt and family.
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
## 2013-10-26 NOTE — Anesthesia Postprocedure Evaluation (Signed)
  Anesthesia Post-op Note  Patient: Anthony Hines  Procedure(s) Performed: Procedure(s) with comments: Thoracic Ten to Lumbar Two for Thoracic Twelve Fracture (N/A) - Thoracic Ten to Lumbar Two for Thoracic Twelve Fracture  Patient Location: ICU  Anesthesia Type:General  Level of Consciousness: awake  Airway and Oxygen Therapy: Patient Spontanous Breathing and Patient connected to nasal cannula oxygen  Post-op Pain: mild  Post-op Assessment: Post-op Vital signs reviewed, Patient's Cardiovascular Status Stable, Respiratory Function Stable, Patent Airway, No signs of Nausea or vomiting and Pain level controlled  Post-op Vital Signs: Reviewed and stable  Last Vitals:  Filed Vitals:   10/26/13 2200  BP: 99/65  Pulse: 100  Temp: 36.8 C  Resp: 10    Complications: No apparent anesthesia complications
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
## 2013-10-26 NOTE — ED Notes (Signed)
Pt transported to Neuro OR , report given to Crna Lorin Picket
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
## 2013-10-26 NOTE — Transfer of Care (Signed)
Immediate Anesthesia Transfer of Care Note  Patient: Anthony Hines  Procedure(s) Performed: Procedure(s) with comments: Thoracic Ten to Lumbar Two for Thoracic Twelve Fracture (N/A) - Thoracic Ten to Lumbar Two for Thoracic Twelve Fracture  Patient Location: PACU and ICU  Anesthesia Type:General  Level of Consciousness: sedated, patient cooperative and responds to stimulation  Airway & Oxygen Therapy: Patient Spontanous Breathing and Patient connected to nasal cannula oxygen  Post-op Assessment: Report given to PACU RN, Post -op Vital signs reviewed and stable and Patient moving all extremities X 4  Post vital signs: Reviewed and stable  Complications: No apparent anesthesia complications

## 2013-10-27 ENCOUNTER — Inpatient Hospital Stay (HOSPITAL_COMMUNITY): Payer: Managed Care, Other (non HMO)

## 2013-10-27 DIAGNOSIS — I10 Essential (primary) hypertension: Secondary | ICD-10-CM | POA: Insufficient documentation

## 2013-10-27 DIAGNOSIS — S2232XA Fracture of one rib, left side, initial encounter for closed fracture: Secondary | ICD-10-CM | POA: Diagnosis present

## 2013-10-27 DIAGNOSIS — S36899A Unspecified injury of other intra-abdominal organs, initial encounter: Secondary | ICD-10-CM | POA: Diagnosis present

## 2013-10-27 DIAGNOSIS — E119 Type 2 diabetes mellitus without complications: Secondary | ICD-10-CM | POA: Insufficient documentation

## 2013-10-27 DIAGNOSIS — S3210XA Unspecified fracture of sacrum, initial encounter for closed fracture: Secondary | ICD-10-CM | POA: Diagnosis present

## 2013-10-27 DIAGNOSIS — S22089A Unspecified fracture of T11-T12 vertebra, initial encounter for closed fracture: Secondary | ICD-10-CM | POA: Diagnosis present

## 2013-10-27 DIAGNOSIS — S37032A Laceration of left kidney, unspecified degree, initial encounter: Secondary | ICD-10-CM | POA: Diagnosis present

## 2013-10-27 DIAGNOSIS — E875 Hyperkalemia: Secondary | ICD-10-CM | POA: Diagnosis not present

## 2013-10-27 DIAGNOSIS — T07XXXA Unspecified multiple injuries, initial encounter: Secondary | ICD-10-CM | POA: Diagnosis present

## 2013-10-27 DIAGNOSIS — D62 Acute posthemorrhagic anemia: Secondary | ICD-10-CM | POA: Diagnosis not present

## 2013-10-27 LAB — BASIC METABOLIC PANEL
Anion gap: 12 (ref 5–15)
Anion gap: 12 (ref 5–15)
Anion gap: 12 (ref 5–15)
BUN: 29 mg/dL — ABNORMAL HIGH (ref 6–23)
BUN: 35 mg/dL — AB (ref 6–23)
BUN: 37 mg/dL — ABNORMAL HIGH (ref 6–23)
CHLORIDE: 108 meq/L (ref 96–112)
CHLORIDE: 106 meq/L (ref 96–112)
CO2: 20 mEq/L (ref 19–32)
CO2: 20 mEq/L (ref 19–32)
CO2: 19 meq/L (ref 19–32)
CREATININE: 1.98 mg/dL — AB (ref 0.50–1.35)
Calcium: 6.9 mg/dL — ABNORMAL LOW (ref 8.4–10.5)
Calcium: 6.8 mg/dL — ABNORMAL LOW (ref 8.4–10.5)
Calcium: 7.2 mg/dL — ABNORMAL LOW (ref 8.4–10.5)
Chloride: 105 mEq/L (ref 96–112)
Creatinine, Ser: 1.65 mg/dL — ABNORMAL HIGH (ref 0.50–1.35)
Creatinine, Ser: 2.06 mg/dL — ABNORMAL HIGH (ref 0.50–1.35)
GFR calc Af Amer: 52 mL/min — ABNORMAL LOW (ref >90)
GFR calc non Af Amer: 45 mL/min — ABNORMAL LOW (ref >90)
GFR calc non Af Amer: 34 mL/min — ABNORMAL LOW (ref >90)
GFR calc non Af Amer: 36 mL/min — ABNORMAL LOW (ref >90)
GFR, EST AFRICAN AMERICAN: 40 mL/min — AB (ref >90)
GFR, EST AFRICAN AMERICAN: 42 mL/min — AB (ref >90)
GLUCOSE: 202 mg/dL — AB (ref 70–99)
Glucose, Bld: 190 mg/dL — ABNORMAL HIGH (ref 70–99)
Glucose, Bld: 163 mg/dL — ABNORMAL HIGH (ref 70–99)
POTASSIUM: 6.2 meq/L — AB (ref 3.7–5.3)
POTASSIUM: 6.2 meq/L — AB (ref 3.7–5.3)
POTASSIUM: 5.4 meq/L — AB (ref 3.7–5.3)
Sodium: 140 mEq/L (ref 137–147)
Sodium: 137 mEq/L (ref 137–147)
Sodium: 137 mEq/L (ref 137–147)

## 2013-10-27 LAB — CBC
HCT: 23.8 % — ABNORMAL LOW (ref 39.0–52.0)
HCT: 23.7 % — ABNORMAL LOW (ref 39.0–52.0)
HEMATOCRIT: 25.4 % — AB (ref 39.0–52.0)
HEMOGLOBIN: 8.8 g/dL — AB (ref 13.0–17.0)
Hemoglobin: 8.2 g/dL — ABNORMAL LOW (ref 13.0–17.0)
Hemoglobin: 8.1 g/dL — ABNORMAL LOW (ref 13.0–17.0)
MCH: 32.6 pg (ref 26.0–34.0)
MCH: 32.5 pg (ref 26.0–34.0)
MCH: 31.6 pg (ref 26.0–34.0)
MCHC: 34.6 g/dL (ref 30.0–36.0)
MCHC: 34.5 g/dL (ref 30.0–36.0)
MCHC: 34.2 g/dL (ref 30.0–36.0)
MCV: 94.1 fL (ref 78.0–100.0)
MCV: 94.4 fL (ref 78.0–100.0)
MCV: 92.6 fL (ref 78.0–100.0)
PLATELETS: 156 10*3/uL (ref 150–400)
Platelets: 173 10*3/uL (ref 150–400)
Platelets: 146 10*3/uL — ABNORMAL LOW (ref 150–400)
RBC: 2.7 MIL/uL — ABNORMAL LOW (ref 4.22–5.81)
RBC: 2.52 MIL/uL — ABNORMAL LOW (ref 4.22–5.81)
RBC: 2.56 MIL/uL — ABNORMAL LOW (ref 4.22–5.81)
RDW: 14.1 % (ref 11.5–15.5)
RDW: 14.3 % (ref 11.5–15.5)
RDW: 14.2 % (ref 11.5–15.5)
WBC: 12.6 10*3/uL — AB (ref 4.0–10.5)
WBC: 10.9 10*3/uL — ABNORMAL HIGH (ref 4.0–10.5)
WBC: 9.8 10*3/uL (ref 4.0–10.5)

## 2013-10-27 LAB — GLUCOSE, CAPILLARY
GLUCOSE-CAPILLARY: 153 mg/dL — AB (ref 70–99)
GLUCOSE-CAPILLARY: 160 mg/dL — AB (ref 70–99)
GLUCOSE-CAPILLARY: 142 mg/dL — AB (ref 70–99)
GLUCOSE-CAPILLARY: 154 mg/dL — AB (ref 70–99)
Glucose-Capillary: 157 mg/dL — ABNORMAL HIGH (ref 70–99)
Glucose-Capillary: 166 mg/dL — ABNORMAL HIGH (ref 70–99)
Glucose-Capillary: 167 mg/dL — ABNORMAL HIGH (ref 70–99)
Glucose-Capillary: 153 mg/dL — ABNORMAL HIGH (ref 70–99)
Glucose-Capillary: 173 mg/dL — ABNORMAL HIGH (ref 70–99)
Glucose-Capillary: 152 mg/dL — ABNORMAL HIGH (ref 70–99)

## 2013-10-27 MED ORDER — SODIUM CHLORIDE 0.45 % IV SOLN
INTRAVENOUS | Status: DC
  Administered 2013-10-27 – 2013-10-30 (×7): via INTRAVENOUS
  Filled 2013-10-27 (×9): qty 1000

## 2013-10-27 MED ORDER — SODIUM CHLORIDE 0.9 % IV SOLN
Freq: Once | INTRAVENOUS | Status: AC
  Administered 2013-10-27: 12:00:00 via INTRAVENOUS

## 2013-10-27 MED ORDER — OXYCODONE HCL 5 MG PO TABS
5.0000 mg | ORAL_TABLET | ORAL | Status: DC | PRN
  Administered 2013-10-27 – 2013-10-28 (×3): 10 mg via ORAL
  Administered 2013-10-28: 15 mg via ORAL
  Administered 2013-10-28 (×3): 10 mg via ORAL
  Administered 2013-10-29 (×2): 15 mg via ORAL
  Administered 2013-10-29: 10 mg via ORAL
  Administered 2013-10-29 – 2013-10-30 (×3): 15 mg via ORAL
  Filled 2013-10-27: qty 2
  Filled 2013-10-27 (×2): qty 3
  Filled 2013-10-27 (×2): qty 2
  Filled 2013-10-27: qty 3
  Filled 2013-10-27 (×3): qty 2
  Filled 2013-10-27 (×2): qty 3
  Filled 2013-10-27: qty 2
  Filled 2013-10-27: qty 3

## 2013-10-27 MED ORDER — HEPARIN SODIUM (PORCINE) 5000 UNIT/ML IJ SOLN
5000.0000 [IU] | Freq: Three times a day (TID) | INTRAMUSCULAR | Status: DC
  Administered 2013-10-28: 5000 [IU] via SUBCUTANEOUS
  Filled 2013-10-27 (×4): qty 1

## 2013-10-27 MED ORDER — ONDANSETRON HCL 4 MG PO TABS: 4.0000 mg | ORAL_TABLET | Freq: Four times a day (QID) | ORAL | Status: DC | PRN

## 2013-10-27 MED ORDER — INSULIN ASPART 100 UNIT/ML ~~LOC~~ SOLN: 10.0000 [IU] | Freq: Once | SUBCUTANEOUS | Status: DC

## 2013-10-27 MED ORDER — INSULIN ASPART 100 UNIT/ML ~~LOC~~ SOLN
10.0000 [IU] | Freq: Once | SUBCUTANEOUS | Status: AC
  Administered 2013-10-27: 10 [IU] via INTRAVENOUS

## 2013-10-27 MED ORDER — CETYLPYRIDINIUM CHLORIDE 0.05 % MT LIQD
7.0000 mL | Freq: Two times a day (BID) | OROMUCOSAL | Status: DC
  Administered 2013-10-28 (×2): 7 mL via OROMUCOSAL

## 2013-10-27 MED ORDER — TETANUS-DIPHTH-ACELL PERTUSSIS 5-2.5-18.5 LF-MCG/0.5 IM SUSP
0.5000 mL | Freq: Once | INTRAMUSCULAR | Status: AC
  Administered 2013-10-27: 0.5 mL via INTRAMUSCULAR
  Filled 2013-10-27: qty 0.5

## 2013-10-27 MED ORDER — ONDANSETRON HCL 4 MG/2ML IJ SOLN: 4.0000 mg | INTRAMUSCULAR | Status: DC | PRN

## 2013-10-27 MED ORDER — PROMETHAZINE HCL 25 MG/ML IJ SOLN: 12.5000 mg | Freq: Four times a day (QID) | INTRAMUSCULAR | Status: DC | PRN

## 2013-10-27 MED ORDER — SODIUM CHLORIDE 0.9 % IV SOLN
1.0000 g | Freq: Once | INTRAVENOUS | Status: AC
  Administered 2013-10-27: 1 g via INTRAVENOUS
  Filled 2013-10-27: qty 10

## 2013-10-27 MED ORDER — PROMETHAZINE HCL 25 MG/ML IJ SOLN
12.5000 mg | Freq: Four times a day (QID) | INTRAMUSCULAR | Status: DC | PRN
  Administered 2013-10-27: 12.5 mg via INTRAVENOUS
  Filled 2013-10-27: qty 1

## 2013-10-27 MED ORDER — ONDANSETRON HCL 4 MG PO TABS
4.0000 mg | ORAL_TABLET | ORAL | Status: DC | PRN
  Administered 2013-10-29 – 2013-10-30 (×2): 4 mg via ORAL
  Filled 2013-10-27 (×3): qty 1
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
## 2013-10-27 NOTE — Progress Notes (Signed)
Patient ID: Anthony Hines, male   DOB: December 23, 1955, 58 y.o.   MRN: [id number]   LOS: 1 day   Subjective: Feels rough, most painful thing is his abrasions.   Objective: Vital signs in last 24 hours: Temp:  [97.5 F (36.4 C)-98.7 F (37.1 C)] 97.8 F (36.6 C) (08/30 0739) Pulse Rate:  [79-117] 113 (08/30 0700) Resp:  [0-26] 15 (08/30 0700) BP: (68-128)/(46-78) 90/47 mmHg (08/30 0700) SpO2:  [90 %-99 %] 99 % (08/30 0700) Weight:  [254 lb (115.214 kg)] 254 lb (115.214 kg) (08/29 2100) Last BM Date:  (PTA)   IS:   Laboratory  CBC  Recent Labs  10/26/13 2156 10/27/13 0215  WBC 13.6* 12.6*  HGB 9.7* 8.8*  HCT 28.0* 25.4*  PLT 166 173   BMET  Recent Labs  10/26/13 1308 10/26/13 1316 10/27/13 0215  NA 138 137 140  K 4.4 4.2 6.2*  CL 99 103 108  CO2 22  --  20  GLUCOSE 277* 287* 202*  BUN 23 31* 29*  CREATININE 1.44* 1.60* 1.65*  CALCIUM 8.4  --  6.9*    Radiology Results PORTABLE CHEST - 1 VIEW  COMPARISON: 10/26/2013  FINDINGS:  Cardiac shadow is mildly enlarged but stable. Lungs are well aerated  bilaterally. Mild right basilar atelectasis is noted. Some slight  increased density is noted throughout the left lung which may be  related to a small effusion. The known left eighth rib fracture is  better visualized on the current exam. No pneumothorax is noted at  this time.  IMPRESSION:  Posteriorly layering effusion on the left.  Left eighth rib fracture without pneumothorax.  Right basilar atelectasis.  Electronically Signed  By: Alcide Clever M.D.  On: 10/27/2013 07:41   Physical Exam General appearance: alert and no distress Resp: clear to auscultation bilaterally Cardio: Tachycardia GI: normal findings: bowel sounds normal and soft, non-tender except superficially Ext: NVI, TTP over right humeral head   Assessment/Plan: [facility] Left rib fx -- Pulmonary toilet T11/12 fxs s/p fusion -- per NS, looks like will be up in TLSO from the  orders Mesenteric injury -- Continue to monitor hgb Left renal lacs Sacral fx -- Awaiting ortho consult Right shoulder pain -- Incompletely imaged on CT, will get x-rays Multiple abrasions -- Local care ABL anemia -- As above Hyperkalemia -- Change IVF, calcium + insulin Hypocalcemia -- Supplement AKI -- Hydrate, likely ATN from contrast/HoTN HTN/DM -- Home meds for DM, hold antiHTN until BP stabilizes FEN -- Advance diet VTE -- SCD's Dispo -- Still having some mild hypotension with narcs, continue ICU today  Critical care time: 0745 -- 0820    Freeman Caldron, PA-C Pager: [phone] General Trauma PA Pager: [phone]  10/27/2013

## 2013-10-27 NOTE — Progress Notes (Signed)
Orthopedic Tech Progress Note Patient Details:  Anthony Hines 1956/02/21 [id number] [employer] paged for brace order. Harvie Heck called back to take order. Patient ID: Anthony Hines, male   DOB: June 14, 1955, 58 y.o.   MRN: [id number]   Orie Rout 10/27/2013, 12:28 PM
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
## 2013-10-28 ENCOUNTER — Encounter (HOSPITAL_COMMUNITY): Payer: Self-pay | Admitting: *Deleted

## 2013-10-28 LAB — GLUCOSE, CAPILLARY
GLUCOSE-CAPILLARY: 136 mg/dL — AB (ref 70–99)
GLUCOSE-CAPILLARY: 128 mg/dL — AB (ref 70–99)
Glucose-Capillary: 158 mg/dL — ABNORMAL HIGH (ref 70–99)
Glucose-Capillary: 124 mg/dL — ABNORMAL HIGH (ref 70–99)

## 2013-10-28 LAB — CBC
HCT: 24.6 % — ABNORMAL LOW (ref 39.0–52.0)
HEMATOCRIT: 24.3 % — AB (ref 39.0–52.0)
Hemoglobin: 8.1 g/dL — ABNORMAL LOW (ref 13.0–17.0)
Hemoglobin: 8.3 g/dL — ABNORMAL LOW (ref 13.0–17.0)
MCH: 31.5 pg (ref 26.0–34.0)
MCH: 31.2 pg (ref 26.0–34.0)
MCHC: 33.3 g/dL (ref 30.0–36.0)
MCHC: 33.7 g/dL (ref 30.0–36.0)
MCV: 94.6 fL (ref 78.0–100.0)
MCV: 92.5 fL (ref 78.0–100.0)
Platelets: 136 10*3/uL — ABNORMAL LOW (ref 150–400)
Platelets: 148 10*3/uL — ABNORMAL LOW (ref 150–400)
RBC: 2.57 MIL/uL — AB (ref 4.22–5.81)
RBC: 2.66 MIL/uL — ABNORMAL LOW (ref 4.22–5.81)
RDW: 14.1 % (ref 11.5–15.5)
RDW: 13.7 % (ref 11.5–15.5)
WBC: 9.2 10*3/uL (ref 4.0–10.5)
WBC: 10.6 10*3/uL — AB (ref 4.0–10.5)

## 2013-10-28 LAB — BASIC METABOLIC PANEL
ANION GAP: 10 (ref 5–15)
BUN: 31 mg/dL — ABNORMAL HIGH (ref 6–23)
CHLORIDE: 103 meq/L (ref 96–112)
CO2: 22 meq/L (ref 19–32)
CREATININE: 1.25 mg/dL (ref 0.50–1.35)
Calcium: 7.4 mg/dL — ABNORMAL LOW (ref 8.4–10.5)
GFR calc non Af Amer: 63 mL/min — ABNORMAL LOW (ref >90)
GFR, EST AFRICAN AMERICAN: 73 mL/min — AB (ref >90)
Glucose, Bld: 171 mg/dL — ABNORMAL HIGH (ref 70–99)
Potassium: 4.5 mEq/L (ref 3.7–5.3)
SODIUM: 135 meq/L — AB (ref 137–147)

## 2013-10-28 NOTE — Progress Notes (Addendum)
Patient ID: Anthony Hines, male   DOB: 07-01-1955, 58 y.o.   MRN: [id number] 2 Days Post-Op  Subjective: No emesis since last night, drinking diet coke, no abdominal pain, good pain control overall. Some tingling in fingers.  Objective: Vital signs in last 24 hours: Temp:  [97.9 F (36.6 C)-99.7 F (37.6 C)] 98.2 F (36.8 C) (08/31 0400) Pulse Rate:  [109-128] 117 (08/31 0700) Resp:  [13-23] 16 (08/31 0700) BP: (82-138)/(43-82) 125/82 mmHg (08/31 0700) SpO2:  [94 %-100 %] 100 % (08/31 0700) Weight:  [281 lb 15.5 oz (127.9 kg)] 281 lb 15.5 oz (127.9 kg) (08/31 0400) Last BM Date:  (PTA)  Intake/Output from previous day: 08/30 0701 - 08/31 0700 In: 2975 [P.O.:240; I.V.:2325; Blood:300; IV Piggyback:110] Out: 2025 [Urine:2025] Intake/Output this shift:    General appearance: alert and cooperative Resp: clear to auscultation bilaterally Cardio: regular rate and rhythm GI: soft, no generalized TTP, abrasions BLQ with Silvadene Extremities: abrasions BUE, BLE Neuro: moves legs well, LT sensation intact feet  Lab Results: CBC   Recent Labs  10/27/13 1520 10/28/13 0105  WBC 9.8 9.2  HGB 8.1* 8.1*  HCT 23.7* 24.3*  PLT 146* 136*   BMET  Recent Labs  10/27/13 0835 10/27/13 1520  NA 137 137  K 6.2* 5.4*  CL 105 106  CO2 20 19  GLUCOSE 190* 163*  BUN 35* 37*  CREATININE 2.06* 1.98*  CALCIUM 6.8* 7.2*    Anti-infectives: Anti-infectives   Start     Dose/Rate Route Frequency Ordered Stop   10/26/13 1604  bacitracin 50,000 Units in sodium chloride irrigation 0.9 % 500 mL irrigation  Status:  Discontinued       As needed 10/26/13 1605 10/26/13 1938   10/26/13 1545  ceFAZolin (ANCEF) 2-3 GM-% IVPB SOLR    Comments:  Alanda Amass   : cabinet override      10/26/13 1545 10/26/13 1625      Assessment/Plan: [facility] Left rib fx -- Pulmonary toilet T11/12 fxs s/p fusion -- per NS, up in TLSO Mesenteric injury -- Hb seems to have stabilized, exam benign Left renal  lacs Sacral fx -- WBAT per Dr. Roda Shutters Right shoulder pain -- X-rays neg Multiple abrasions -- Local care ABL anemia -- As above, check this PM, D/C sub cut heparin Hyperkalemia -- Check BMET now Hypocalcemia -- check now AKI -- Hydrate, likely ATN from contrast/HoTN, check now, continue foley today HTN/DM -- Home meds for DM, hold antiHTN until BP stabilizes, D/C Amaryl until eating FEN -- some ileus, clears only for now, seems slightly improved VTE -- SCD's, D/C sub cut heparin until Hb stable Dispo -- ICU today  LOS: 2 days    Violeta Gelinas, MD, MPH, FACS Trauma: [phone] General Surgery: [phone]  10/28/2013
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
## 2013-10-28 NOTE — Progress Notes (Signed)
UR completed.  Takeisha Cianci, RN BSN M[facility] CCM Trauma/Neuro ICU Case Manager [phone]  

## 2013-10-28 NOTE — Progress Notes (Signed)
Rehab Admissions Coordinator Note:  Patient was screened by Gustaf Mccarter L for appropriateness for an Inpatient Acute Rehab Consult.  At this time, we are recommending Inpatient Rehab consult.  Cyrene Gharibian L 10/28/2013, 12:10 PM  I can be reached at [phone].

## 2013-10-28 NOTE — Progress Notes (Signed)
Patient was screened by Michela Herst for appropriateness for an Inpatient Acute Rehab consult.  At this time, we are recommending Inpatient Rehab consult.  Please order when you feel appropriate.   Deward Sebek PT Inpatient Rehab Admissions Coordinator Cell [phone] Office [phone]  

## 2013-10-29 ENCOUNTER — Encounter (HOSPITAL_COMMUNITY): Payer: Self-pay | Admitting: Neurosurgery

## 2013-10-29 DIAGNOSIS — S329XXA Fracture of unspecified parts of lumbosacral spine and pelvis, initial encounter for closed fracture: Secondary | ICD-10-CM

## 2013-10-29 DIAGNOSIS — S22009A Unspecified fracture of unspecified thoracic vertebra, initial encounter for closed fracture: Secondary | ICD-10-CM

## 2013-10-29 DIAGNOSIS — S32009A Unspecified fracture of unspecified lumbar vertebra, initial encounter for closed fracture: Secondary | ICD-10-CM

## 2013-10-29 LAB — GLUCOSE, CAPILLARY
GLUCOSE-CAPILLARY: 120 mg/dL — AB (ref 70–99)
GLUCOSE-CAPILLARY: 183 mg/dL — AB (ref 70–99)
Glucose-Capillary: 186 mg/dL — ABNORMAL HIGH (ref 70–99)

## 2013-10-29 LAB — BASIC METABOLIC PANEL
Anion gap: 11 (ref 5–15)
BUN: 27 mg/dL — AB (ref 6–23)
CALCIUM: 7.5 mg/dL — AB (ref 8.4–10.5)
CO2: 24 mEq/L (ref 19–32)
Chloride: 101 mEq/L (ref 96–112)
Creatinine, Ser: 1.2 mg/dL (ref 0.50–1.35)
GFR calc Af Amer: 76 mL/min — ABNORMAL LOW (ref >90)
GFR, EST NON AFRICAN AMERICAN: 66 mL/min — AB (ref >90)
GLUCOSE: 131 mg/dL — AB (ref 70–99)
Potassium: 4.4 mEq/L (ref 3.7–5.3)
Sodium: 136 mEq/L — ABNORMAL LOW (ref 137–147)

## 2013-10-29 LAB — CBC
HCT: 20.9 % — ABNORMAL LOW (ref 39.0–52.0)
HCT: 21.4 % — ABNORMAL LOW (ref 39.0–52.0)
Hemoglobin: 7.3 g/dL — ABNORMAL LOW (ref 13.0–17.0)
Hemoglobin: 7.4 g/dL — ABNORMAL LOW (ref 13.0–17.0)
MCH: 32.2 pg (ref 26.0–34.0)
MCH: 32.6 pg (ref 26.0–34.0)
MCHC: 34.9 g/dL (ref 30.0–36.0)
MCHC: 34.6 g/dL (ref 30.0–36.0)
MCV: 92.1 fL (ref 78.0–100.0)
MCV: 94.3 fL (ref 78.0–100.0)
Platelets: 142 10*3/uL — ABNORMAL LOW (ref 150–400)
Platelets: 127 10*3/uL — ABNORMAL LOW (ref 150–400)
RBC: 2.27 MIL/uL — AB (ref 4.22–5.81)
RBC: 2.27 MIL/uL — ABNORMAL LOW (ref 4.22–5.81)
RDW: 13.3 % (ref 11.5–15.5)
RDW: 13.3 % (ref 11.5–15.5)
WBC: 9.5 10*3/uL (ref 4.0–10.5)
WBC: 9 10*3/uL (ref 4.0–10.5)

## 2013-10-29 LAB — POCT I-STAT 4, (NA,K, GLUC, HGB,HCT)
Glucose, Bld: 217 mg/dL — ABNORMAL HIGH (ref 70–99)
Glucose, Bld: 221 mg/dL — ABNORMAL HIGH (ref 70–99)
HCT: 26 % — ABNORMAL LOW (ref 39.0–52.0)
HEMATOCRIT: 31 % — AB (ref 39.0–52.0)
HEMOGLOBIN: 10.5 g/dL — AB (ref 13.0–17.0)
Hemoglobin: 8.8 g/dL — ABNORMAL LOW (ref 13.0–17.0)
POTASSIUM: 4.8 meq/L (ref 3.7–5.3)
Potassium: 5.4 meq/L — ABNORMAL HIGH (ref 3.7–5.3)
Sodium: 139 mEq/L (ref 137–147)
Sodium: 140 meq/L (ref 137–147)

## 2013-10-29 MED ORDER — GLIMEPIRIDE 4 MG PO TABS
2.0000 mg | ORAL_TABLET | Freq: Every day | ORAL | Status: DC
  Administered 2013-10-29 – 2013-10-30 (×2): 2 mg via ORAL
  Filled 2013-10-29 (×3): qty 1

## 2013-10-29 NOTE — Progress Notes (Signed)
Patient arrived from via wheelchair and was assisted to the recliner (2 assist).  Patient was oriented to our unit and shown how to use his phone to call the RN or NT.  Patient arrived with a foley in place.  Anthony Hines
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
## 2013-10-29 NOTE — Progress Notes (Signed)
Patient's dressings were changed.  He was premedicated with morphine and tolerated well. Lance Bosch, RN
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
## 2013-10-29 NOTE — Progress Notes (Signed)
We have begun [employer] approval for an inpt rehab admission. Kendell Bane will follow up 9/2. [phone] she can be reached. Hopeful for admit tomorrow if felt medically ready. [phone]
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
## 2013-10-30 ENCOUNTER — Inpatient Hospital Stay (HOSPITAL_COMMUNITY)
Admission: RE | Admit: 2013-10-30 | Discharge: 2013-11-08 | DRG: 945 | Disposition: A | Discharge status: Home / Self Care | Payer: Managed Care, Other (non HMO) | Source: Intra-hospital | Attending: Physical Medicine & Rehabilitation | Admitting: Physical Medicine & Rehabilitation

## 2013-10-30 DIAGNOSIS — T1490XA Injury, unspecified, initial encounter: Secondary | ICD-10-CM | POA: Diagnosis present

## 2013-10-30 DIAGNOSIS — S22009A Unspecified fracture of unspecified thoracic vertebra, initial encounter for closed fracture: Secondary | ICD-10-CM

## 2013-10-30 DIAGNOSIS — S3210XA Unspecified fracture of sacrum, initial encounter for closed fracture: Secondary | ICD-10-CM | POA: Diagnosis present

## 2013-10-30 DIAGNOSIS — R339 Retention of urine, unspecified: Secondary | ICD-10-CM | POA: Diagnosis present

## 2013-10-30 DIAGNOSIS — K5900 Constipation, unspecified: Secondary | ICD-10-CM | POA: Diagnosis present

## 2013-10-30 DIAGNOSIS — E1149 Type 2 diabetes mellitus with other diabetic neurological complication: Secondary | ICD-10-CM | POA: Diagnosis present

## 2013-10-30 DIAGNOSIS — S37039A Laceration of unspecified kidney, unspecified degree, initial encounter: Secondary | ICD-10-CM | POA: Diagnosis present

## 2013-10-30 DIAGNOSIS — I10 Essential (primary) hypertension: Secondary | ICD-10-CM | POA: Diagnosis present

## 2013-10-30 DIAGNOSIS — Z5189 Encounter for other specified aftercare: Principal | ICD-10-CM

## 2013-10-30 DIAGNOSIS — E785 Hyperlipidemia, unspecified: Secondary | ICD-10-CM | POA: Diagnosis present

## 2013-10-30 DIAGNOSIS — S322XXA Fracture of coccyx, initial encounter for closed fracture: Secondary | ICD-10-CM

## 2013-10-30 DIAGNOSIS — Z79899 Other long term (current) drug therapy: Secondary | ICD-10-CM | POA: Diagnosis not present

## 2013-10-30 DIAGNOSIS — E1142 Type 2 diabetes mellitus with diabetic polyneuropathy: Secondary | ICD-10-CM | POA: Diagnosis present

## 2013-10-30 DIAGNOSIS — S36899A Unspecified injury of other intra-abdominal organs, initial encounter: Secondary | ICD-10-CM | POA: Diagnosis present

## 2013-10-30 DIAGNOSIS — D62 Acute posthemorrhagic anemia: Secondary | ICD-10-CM | POA: Diagnosis present

## 2013-10-30 LAB — TYPE AND SCREEN
ABO/RH(D): O POS
ANTIBODY SCREEN: NEGATIVE
UNIT DIVISION: 0
UNIT DIVISION: 0
UNIT DIVISION: 0
UNIT DIVISION: 0
Unit division: 0
Unit division: 0
Unit division: 0
Unit division: 0

## 2013-10-30 LAB — BASIC METABOLIC PANEL
ANION GAP: 11 (ref 5–15)
BUN: 25 mg/dL — AB (ref 6–23)
CHLORIDE: 101 meq/L (ref 96–112)
CO2: 24 mEq/L (ref 19–32)
Calcium: 7.8 mg/dL — ABNORMAL LOW (ref 8.4–10.5)
Creatinine, Ser: 1.28 mg/dL (ref 0.50–1.35)
GFR calc non Af Amer: 61 mL/min — ABNORMAL LOW (ref >90)
GFR, EST AFRICAN AMERICAN: 71 mL/min — AB (ref >90)
Glucose, Bld: 112 mg/dL — ABNORMAL HIGH (ref 70–99)
POTASSIUM: 4.6 meq/L (ref 3.7–5.3)
SODIUM: 136 meq/L — AB (ref 137–147)

## 2013-10-30 LAB — CBC
HEMATOCRIT: 21.7 % — AB (ref 39.0–52.0)
Hemoglobin: 7.5 g/dL — ABNORMAL LOW (ref 13.0–17.0)
MCH: 32.6 pg (ref 26.0–34.0)
MCHC: 34.6 g/dL (ref 30.0–36.0)
MCV: 94.3 fL (ref 78.0–100.0)
Platelets: 154 10*3/uL (ref 150–400)
RBC: 2.3 MIL/uL — ABNORMAL LOW (ref 4.22–5.81)
RDW: 13.1 % (ref 11.5–15.5)
WBC: 10 10*3/uL (ref 4.0–10.5)

## 2013-10-30 LAB — GLUCOSE, CAPILLARY
GLUCOSE-CAPILLARY: 127 mg/dL — AB (ref 70–99)
Glucose-Capillary: 342 mg/dL — ABNORMAL HIGH (ref 70–99)
Glucose-Capillary: 152 mg/dL — ABNORMAL HIGH (ref 70–99)

## 2013-10-30 MED ORDER — GLIMEPIRIDE 2 MG PO TABS
2.0000 mg | ORAL_TABLET | Freq: Every day | ORAL | Status: DC
  Administered 2013-10-31 – 2013-11-08 (×9): 2 mg via ORAL
  Filled 2013-10-30 (×10): qty 1

## 2013-10-30 MED ORDER — OXYCODONE HCL 5 MG PO TABS
5.0000 mg | ORAL_TABLET | ORAL | Status: DC | PRN
  Administered 2013-10-30 – 2013-10-31 (×4): 15 mg via ORAL
  Administered 2013-10-31: 5 mg via ORAL
  Administered 2013-10-31: 10 mg via ORAL
  Administered 2013-11-01: 15 mg via ORAL
  Administered 2013-11-01: 10 mg via ORAL
  Administered 2013-11-01 (×2): 15 mg via ORAL
  Administered 2013-11-01: 5 mg via ORAL
  Administered 2013-11-02: 15 mg via ORAL
  Administered 2013-11-02: 10 mg via ORAL
  Administered 2013-11-02 – 2013-11-04 (×9): 15 mg via ORAL
  Administered 2013-11-04: 10 mg via ORAL
  Administered 2013-11-04 – 2013-11-08 (×21): 15 mg via ORAL
  Filled 2013-10-30 (×3): qty 3
  Filled 2013-10-30: qty 1
  Filled 2013-10-30 (×3): qty 3
  Filled 2013-10-30: qty 2
  Filled 2013-10-30 (×3): qty 3
  Filled 2013-10-30: qty 2
  Filled 2013-10-30 (×5): qty 3
  Filled 2013-10-30: qty 1
  Filled 2013-10-30 (×2): qty 3
  Filled 2013-10-30: qty 2
  Filled 2013-10-30 (×6): qty 3
  Filled 2013-10-30: qty 2
  Filled 2013-10-30 (×16): qty 3
  Filled 2013-10-30: qty 2

## 2013-10-30 MED ORDER — POLYETHYLENE GLYCOL 3350 17 G PO PACK
17.0000 g | PACK | Freq: Every day | ORAL | Status: DC
  Administered 2013-10-31 – 2013-11-08 (×9): 17 g via ORAL
  Filled 2013-10-30 (×10): qty 1

## 2013-10-30 MED ORDER — ATORVASTATIN CALCIUM 10 MG PO TABS
10.0000 mg | ORAL_TABLET | Freq: Every day | ORAL | Status: DC
  Administered 2013-10-31 – 2013-11-08 (×9): 10 mg via ORAL
  Filled 2013-10-30 (×10): qty 1

## 2013-10-30 MED ORDER — SORBITOL 70 % SOLN: 30.0000 mL | Freq: Every day | Status: DC | PRN

## 2013-10-30 MED ORDER — TRAMADOL HCL 50 MG PO TABS
50.0000 mg | ORAL_TABLET | Freq: Four times a day (QID) | ORAL | Status: DC
  Administered 2013-10-30: 50 mg via ORAL
  Filled 2013-10-30: qty 1

## 2013-10-30 MED ORDER — ACETAMINOPHEN 325 MG PO TABS
325.0000 mg | ORAL_TABLET | ORAL | Status: DC | PRN
  Administered 2013-11-04: 650 mg via ORAL
  Filled 2013-10-30: qty 2

## 2013-10-30 MED ORDER — DOCUSATE SODIUM 100 MG PO CAPS
100.0000 mg | ORAL_CAPSULE | Freq: Two times a day (BID) | ORAL | Status: DC
  Administered 2013-10-30 – 2013-11-08 (×17): 100 mg via ORAL
  Filled 2013-10-30 (×21): qty 1

## 2013-10-30 MED ORDER — BISACODYL 10 MG RE SUPP
10.0000 mg | Freq: Once | RECTAL | Status: AC
  Administered 2013-10-30: 10 mg via RECTAL
  Filled 2013-10-30: qty 1

## 2013-10-30 MED ORDER — INSULIN ASPART 100 UNIT/ML ~~LOC~~ SOLN
0.0000 [IU] | Freq: Three times a day (TID) | SUBCUTANEOUS | Status: DC
  Administered 2013-10-30: 11 [IU] via SUBCUTANEOUS

## 2013-10-30 MED ORDER — SILVER SULFADIAZINE 1 % EX CREA
TOPICAL_CREAM | Freq: Two times a day (BID) | CUTANEOUS | Status: DC
  Administered 2013-10-30 – 2013-11-02 (×5): via TOPICAL
  Administered 2013-11-03: 1 via TOPICAL
  Administered 2013-11-04 – 2013-11-08 (×6): via TOPICAL
  Filled 2013-10-30 (×3): qty 85

## 2013-10-30 MED ORDER — AMLODIPINE BESYLATE 5 MG PO TABS
5.0000 mg | ORAL_TABLET | Freq: Every day | ORAL | Status: DC
  Administered 2013-10-31 – 2013-11-08 (×9): 5 mg via ORAL
  Filled 2013-10-30 (×10): qty 1

## 2013-10-30 MED ORDER — METHOCARBAMOL 500 MG PO TABS
500.0000 mg | ORAL_TABLET | Freq: Four times a day (QID) | ORAL | Status: DC | PRN
Start: 2013-10-30 — End: 2013-11-08
  Administered 2013-10-31 – 2013-11-07 (×11): 500 mg via ORAL
  Filled 2013-10-30 (×11): qty 1

## 2013-10-30 MED ORDER — ONDANSETRON HCL 4 MG PO TABS: 4.0000 mg | ORAL_TABLET | Freq: Four times a day (QID) | ORAL | Status: DC | PRN

## 2013-10-30 MED ORDER — AMLODIPINE BESYLATE 5 MG PO TABS
5.0000 mg | ORAL_TABLET | Freq: Every day | ORAL | Status: DC
Start: 2013-10-30 — End: 2013-10-30
  Administered 2013-10-30: 5 mg via ORAL
  Filled 2013-10-30: qty 1

## 2013-10-30 MED ORDER — GABAPENTIN 300 MG PO CAPS
300.0000 mg | ORAL_CAPSULE | Freq: Two times a day (BID) | ORAL | Status: DC
  Administered 2013-10-30 – 2013-11-08 (×18): 300 mg via ORAL
  Filled 2013-10-30 (×20): qty 1

## 2013-10-30 MED ORDER — ONDANSETRON HCL 4 MG/2ML IJ SOLN: 4.0000 mg | Freq: Four times a day (QID) | INTRAMUSCULAR | Status: DC | PRN

## 2013-10-30 MED ORDER — OXYCODONE HCL 5 MG PO TABS
10.0000 mg | ORAL_TABLET | ORAL | Status: DC | PRN
  Administered 2013-10-30 (×2): 20 mg via ORAL
  Filled 2013-10-30 (×2): qty 4

## 2013-10-30 NOTE — Progress Notes (Signed)
Patient ID: Anthony Hines, male   DOB: 1955-04-24, 58 y.o.   MRN: [id number] Pt was admitted to 4 W01 from 4 [facility]. Admission vital sign are stable

## 2013-10-30 NOTE — Progress Notes (Signed)
Patient sitting up in a chair, still a bit bloated,but passing gas and eating okay.  I do believe that he would benefit from Rehab.  This patient has been seen and I agree with the findings and treatment plan.  Marta Lamas. Gae Bon, MD, FACS [phone] (pager) [phone] (direct pager) Trauma Surgeon
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
## 2013-10-30 NOTE — Progress Notes (Signed)
Report given to 4W.  Patient going to 4w03.  Lance Bosch, RN
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
## 2013-10-31 ENCOUNTER — Inpatient Hospital Stay (HOSPITAL_COMMUNITY): Payer: Managed Care, Other (non HMO) | Admitting: Physical Therapy

## 2013-10-31 ENCOUNTER — Inpatient Hospital Stay (HOSPITAL_COMMUNITY): Payer: Managed Care, Other (non HMO) | Admitting: Occupational Therapy

## 2013-10-31 LAB — COMPREHENSIVE METABOLIC PANEL
ALT: 33 U/L (ref 0–53)
AST: 39 U/L — ABNORMAL HIGH (ref 0–37)
Albumin: 2.5 g/dL — ABNORMAL LOW (ref 3.5–5.2)
Alkaline Phosphatase: 55 U/L (ref 39–117)
Anion gap: 14 (ref 5–15)
BUN: 26 mg/dL — ABNORMAL HIGH (ref 6–23)
CALCIUM: 8.1 mg/dL — AB (ref 8.4–10.5)
CO2: 24 meq/L (ref 19–32)
Chloride: 97 mEq/L (ref 96–112)
Creatinine, Ser: 1.15 mg/dL (ref 0.50–1.35)
GFR, EST AFRICAN AMERICAN: 80 mL/min — AB (ref >90)
GFR, EST NON AFRICAN AMERICAN: 69 mL/min — AB (ref >90)
GLUCOSE: 197 mg/dL — AB (ref 70–99)
Potassium: 4.5 mEq/L (ref 3.7–5.3)
SODIUM: 135 meq/L — AB (ref 137–147)
Total Bilirubin: 1.1 mg/dL (ref 0.3–1.2)
Total Protein: 6.4 g/dL (ref 6.0–8.3)

## 2013-10-31 LAB — CBC WITH DIFFERENTIAL/PLATELET
Basophils Absolute: 0.1 10*3/uL (ref 0.0–0.1)
Basophils Relative: 1 % (ref 0–1)
EOS PCT: 3 % (ref 0–5)
Eosinophils Absolute: 0.3 10*3/uL (ref 0.0–0.7)
HCT: 22.3 % — ABNORMAL LOW (ref 39.0–52.0)
HEMOGLOBIN: 7.5 g/dL — AB (ref 13.0–17.0)
LYMPHS ABS: 2.2 10*3/uL (ref 0.7–4.0)
LYMPHS PCT: 17 % (ref 12–46)
MCH: 32.2 pg (ref 26.0–34.0)
MCHC: 33.6 g/dL (ref 30.0–36.0)
MCV: 95.7 fL (ref 78.0–100.0)
MONOS PCT: 11 % (ref 3–12)
Monocytes Absolute: 1.4 10*3/uL — ABNORMAL HIGH (ref 0.1–1.0)
Neutro Abs: 8.9 10*3/uL — ABNORMAL HIGH (ref 1.7–7.7)
Neutrophils Relative %: 69 % (ref 43–77)
PLATELETS: 240 10*3/uL (ref 150–400)
RBC: 2.33 MIL/uL — AB (ref 4.22–5.81)
RDW: 13.2 % (ref 11.5–15.5)
WBC: 13 10*3/uL — AB (ref 4.0–10.5)

## 2013-10-31 LAB — GLUCOSE, CAPILLARY
GLUCOSE-CAPILLARY: 154 mg/dL — AB (ref 70–99)
Glucose-Capillary: 127 mg/dL — ABNORMAL HIGH (ref 70–99)
Glucose-Capillary: 177 mg/dL — ABNORMAL HIGH (ref 70–99)
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
## 2013-10-31 NOTE — Progress Notes (Signed)
Patient oxygen saturations 85% on room air.Oxygen placed at 3 lnc to get saturations of 95%.Will continue to monitor.
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
## 2013-10-31 NOTE — Progress Notes (Signed)
Patient information reviewed and entered into eRehab system by Babbie Dondlinger, RN, CRRN, PPS Coordinator.  Information including medical coding and functional independence measure will be reviewed and updated through discharge.    
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
## 2013-11-01 ENCOUNTER — Inpatient Hospital Stay (HOSPITAL_COMMUNITY): Payer: Managed Care, Other (non HMO) | Admitting: Physical Therapy

## 2013-11-01 ENCOUNTER — Inpatient Hospital Stay (HOSPITAL_COMMUNITY): Payer: Managed Care, Other (non HMO) | Admitting: *Deleted

## 2013-11-01 ENCOUNTER — Inpatient Hospital Stay (HOSPITAL_COMMUNITY): Payer: Managed Care, Other (non HMO)

## 2013-11-01 DIAGNOSIS — M7989 Other specified soft tissue disorders: Secondary | ICD-10-CM

## 2013-11-01 LAB — HEMOGLOBIN AND HEMATOCRIT, BLOOD
HCT: 21.4 % — ABNORMAL LOW (ref 39.0–52.0)
HEMOGLOBIN: 7.2 g/dL — AB (ref 13.0–17.0)

## 2013-11-01 LAB — GLUCOSE, CAPILLARY
Glucose-Capillary: 162 mg/dL — ABNORMAL HIGH (ref 70–99)
Glucose-Capillary: 156 mg/dL — ABNORMAL HIGH (ref 70–99)
Glucose-Capillary: 165 mg/dL — ABNORMAL HIGH (ref 70–99)

## 2013-11-01 LAB — PREPARE RBC (CROSSMATCH)

## 2013-11-01 MED ORDER — ACETAMINOPHEN 325 MG PO TABS
650.0000 mg | ORAL_TABLET | Freq: Once | ORAL | Status: AC
  Administered 2013-11-01: 650 mg via ORAL
  Filled 2013-11-01: qty 2

## 2013-11-01 MED ORDER — SODIUM CHLORIDE 0.9 % IV SOLN
Freq: Once | INTRAVENOUS | Status: AC
  Administered 2013-11-01: 16:00:00 via INTRAVENOUS

## 2013-11-01 MED ORDER — DIPHENHYDRAMINE HCL 25 MG PO CAPS
25.0000 mg | ORAL_CAPSULE | Freq: Once | ORAL | Status: AC
Start: 2013-11-01 — End: 2013-11-01
  Administered 2013-11-01: 25 mg via ORAL
  Filled 2013-11-01: qty 1

## 2013-11-01 MED ORDER — FUROSEMIDE 10 MG/ML IJ SOLN
20.0000 mg | Freq: Once | INTRAMUSCULAR | Status: AC
  Administered 2013-11-01: 20 mg via INTRAVENOUS
  Filled 2013-11-01: qty 2

## 2013-11-01 NOTE — Care Management Note (Signed)
[facility] Individual Statement of Services  Patient Name:  Anthony Hines  Date:  11/01/2013  Welcome to the [facility]patient [facility].  Our goal is to provide you with an individualized program based on your diagnosis and situation, designed to meet your specific needs.  With this comprehensive rehabilitation program, you will be expected to participate in at least 3 hours of rehabilitation therapies Monday-Friday, with modified therapy programming on the weekends.  Your rehabilitation program will include the following services:  Physical Therapy (PT), Occupational Therapy (OT), 24 hour per day rehabilitation nursing, Therapeutic Recreaction (TR), Case Management (Social Worker), Rehabilitation Medicine, Nutrition Services and Pharmacy Services  Weekly team conferences will be held on Tuesday to discuss your progress.  Your Social Worker will talk with you frequently to get your input and to update you on team discussions.  Team conferences with you and your family in attendance may also be held.  Expected length of stay: 21 days  Overall anticipated outcome: supervision/ minimal assist  Depending on your progress and recovery, your program may change. Your Social Worker will coordinate services and will keep you informed of any changes. Your Social Worker's name and contact numbers are listed  below.  The following services may also be recommended but are not provided by the Inpatient Rehabilitation Center:   Driving Evaluations  Home Health Rehabiltiation Services  Outpatient Rehabilitation Services  Vocational Rehabilitation   Arrangements will be made to provide these services after discharge if needed.  Arrangements include referral to agencies that provide these services.  Your insurance has been verified to be:  [place] Your primary doctor is:  Dr. Shary Decamp  Pertinent information will be shared with your doctor and your insurance company.  Social Worker:   [place], [place] [phone] or ([phone]   Information discussed with and copy given to patient by: Amada Jupiter, 11/01/2013, 2:21 PM
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
## 2013-11-01 NOTE — Progress Notes (Signed)
VASCULAR LAB PRELIMINARY  PRELIMINARY  PRELIMINARY  PRELIMINARY  Bilateral lower extremity venous duplex completed.    Preliminary report:  Bilateral:  No evidence of DVT, superficial thrombosis, or Baker's Cyst.   Anthony Hines, RVS 11/01/2013, 9:48 AM

## 2013-11-01 NOTE — Progress Notes (Signed)
Physical Therapy Session Note  Patient Details  Name: Anthony Hines MRN: [id number] Date of Birth: 17-May-1955  Today's Date: 11/01/2013 PT Individual Time: 1130-1200 PT Individual Time Calculation (min): 30 min   Short Term Goals: Week 1:  PT Short Term Goal 1 (Week 1): Pt will perform supine<>sit with mod A and min cueing for adherence to spinal precautions. PT Short Term Goal 2 (Week 1): Pt will consistently perform bed<>w/c transfer with min A. PT Short Term Goal 3 (Week 1): Pt will perform gait x30' in controlled environment with min A using LRAD. PT Short Term Goal 4 (Week 1): Pt will effectly demonstrate pressure relief technique without assistance.  Skilled Therapeutic Interventions/Progress Updates:   Pt received sitting in w/c, agreeable to therapy. Focus on activity tolerance, stand pivot transfers, bed mobility, and ambulation. Pt performed stand pivot transfers bed <> w/c using RW with min guard and verbal cues for safe hand placement. Stedy removed from patient room and safety plan updated. Pt performed bed mobility with sit <> supine from flat bed with no rail and mod A overall. Gait training using RW x 50 ft with min guard. Patient fatigued quickly and reports feeling like arms are going to give out. Pt able to recall 2/3 spinal precautions, education provided. Pt requires increased time for mobility and frequent rest breaks due to SOB and pain. Pt left sitting in w/c with lunch tray set up and all needs within reach.  Therapy Documentation Precautions:  Precautions Precautions: Back;Fall Precaution Booklet Issued: Yes (comment) Precaution Comments: Pt able to verbalize 2 of 3 spinal precautions. Required Braces or Orthoses: Spinal Brace Spinal Brace: Thoracolumbosacral orthotic;Applied in sitting position Restrictions Weight Bearing Restrictions: No Pain: Pain Assessment Pain Assessment: 0-10 Pain Score: 4  Pain Type: Acute pain Pain Location: Back Pain Orientation:  Mid;Lower Pain Descriptors / Indicators: Sharp Pain Frequency: Constant Pain Onset: On-going Pain Intervention(s): Medication (See eMAR)  See FIM for current functional status  Therapy/Group: Individual Therapy  Kerney Elbe 11/01/2013, 12:08 PM

## 2013-11-01 NOTE — Progress Notes (Addendum)
Occupational Therapy Session Note  Patient Details  Name: Anthony Hines MRN: [id number] Date of Birth: March 11, 1955  Today's Date: 11/01/2013 OT Individual Time:  - [phone]   (45 min)      Short Term Goals: Week 1:     Skilled Therapeutic Interventions/Progress Updates:   1st session:  Pt. Lying in bed.  Addressed bed mobility, sitting balance to don brace; squat pivot transfers,       Pt. was max assist with bed mobility, mod A sit<>stand and squat pivot, min assist from EOB to dropped arm commode chair.  Pt had BM.  Unable to clean peri area. Pt. Is supervision with UB bathing but needs assist with TLSO.   Transferred back to bed due to ultra sound test.    2nd session: Time:1510-1610   (60 min) Pain:  6/10  Back    Individual session  Addressed bed mobility,  wc mobility, sit to stand, standing balance, and weight shifting.  Ortho Tech applied new anterior TLSO which fits pt better.  Pt. Needed mod assist with rolling and coming from sideling to sit.  Transferred to wc with stand pivot transfer using RW and minimal assist.  Pt. Propelled wc to gym.   Pt. Stood for 4 minutes with weight shifting, standing balance without using hands, and stepping balance with heel toe and backwards with min assist.   Pt. Propelled wc back to room      Therapy Documentation Precautions:  Precautions Precautions: Back;Fall Precaution Booklet Issued: Yes (comment) Precaution Comments: Pt able to verbalize 2 of 3 spinal precautions. Required Braces or Orthoses: Spinal Brace Spinal Brace: Thoracolumbosacral orthotic;Applied in sitting position Restrictions Weight Bearing Restrictions: No      Pain: Pain Assessment Pain Assessment: 0-10 Pain Score: 7  Pain Type: Acute pain Pain Location: Back Pain Orientation: Mid;Lower Pain Descriptors / Indicators: Sharp Pain Frequency: Constant Pain Onset: On-going Pain Intervention(s): Medication (See eMAR)          See FIM for current functional  status  Therapy/Group: Individual Therapy  Humberto Seals 11/01/2013, 8:19 AM

## 2013-11-01 NOTE — Plan of Care (Signed)
Problem: SCI BLADDER ELIMINATION Goal: RH STG MANAGE BLADDER WITH ASSISTANCE STG Manage Bladder With Min Assistance  Outcome: Not Progressing Patient requiring q8h and prn in and out cath due to inability to urinate
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
## 2013-11-01 NOTE — Progress Notes (Signed)
Physical Therapy Session Note  Patient Details  Name: Anthony Hines MRN: [id number] Date of Birth: Aug 30, 1955  Today's Date: 11/01/2013 PT Individual Time: 1000-1100 PT Individual Time Calculation (min): 60 min   Short Term Goals: Week 1:  PT Short Term Goal 1 (Week 1): Pt will perform supine<>sit with mod A and min cueing for adherence to spinal precautions. PT Short Term Goal 2 (Week 1): Pt will consistently perform bed<>w/c transfer with min A. PT Short Term Goal 3 (Week 1): Pt will perform gait x30' in controlled environment with min A using LRAD. PT Short Term Goal 4 (Week 1): Pt will effectly demonstrate pressure relief technique without assistance.  Skilled Therapeutic Interventions/Progress Updates:  1:1. Pt received semi-reclined in bed, ready for therapy. Focus this session on bed mobility, functional transfers, functional w/c propulsion and gait training. Pt able to recall 3/3 back precautions w/out cues and req mod A for t/f L sidelying>sit EOB w/ cues for log roll technique. Pt reporting 6/10 pain in back, RN made aware and present to provide pain meds. Pt very anxious and req min encouragement to attempt squat/SPT bed<>w/c w/ use of bed rail<>arm rests, pt req min A overall w/ mod cues for seq and technique. Pt propelled w/c 175'x2 w/ B UE/LE in busy hallway environment around obstacles w/ supervision. Pt req occasional min A for anterior weight shift in chair due to TLSO.  Pt utilized RW in standing due to report of increased pelvic pain. Pt initially amb 30' w/ RW, req min encouragement to attempt further distance ultimately amb 44' w/ min guard-min A. Pt demonstrating progressively increased pace with repetition, short step-through to slight reciprocal pattern w/ cues for posture and step length. Decreased hip/knee flexion overall. Pt able to safely negotiate up/down 5 steps w/ B rail, min A and cues for step-to pattern.   Pt req seated rest breaks throughout session due to pain and  fatigue. Pt left sitting in w/c at end of session w/ all needs in reach.   Therapy Documentation Precautions:  Precautions Precautions: Back;Fall Precaution Booklet Issued: Yes (comment) Precaution Comments: Pt able to verbalize 2 of 3 spinal precautions. Required Braces or Orthoses: Spinal Brace Spinal Brace: Thoracolumbosacral orthotic;Applied in sitting position Restrictions Weight Bearing Restrictions: No   Pain: Pain Assessment Pain Assessment: 0-10 Pain Score: 4  Pain Type: Acute pain Pain Location: Back Pain Orientation: Mid;Lower Pain Descriptors / Indicators: Sharp Pain Frequency: Constant Pain Onset: On-going Pain Intervention(s): Medication (See eMAR)  See FIM for current functional status  Therapy/Group: Individual Therapy  Denzil Hughes 11/01/2013, 12:22 PM

## 2013-11-02 ENCOUNTER — Inpatient Hospital Stay (HOSPITAL_COMMUNITY): Payer: Managed Care, Other (non HMO) | Admitting: Occupational Therapy

## 2013-11-02 DIAGNOSIS — S3210XA Unspecified fracture of sacrum, initial encounter for closed fracture: Secondary | ICD-10-CM

## 2013-11-02 DIAGNOSIS — S22009A Unspecified fracture of unspecified thoracic vertebra, initial encounter for closed fracture: Secondary | ICD-10-CM

## 2013-11-02 DIAGNOSIS — S322XXA Fracture of coccyx, initial encounter for closed fracture: Secondary | ICD-10-CM

## 2013-11-02 DIAGNOSIS — I10 Essential (primary) hypertension: Secondary | ICD-10-CM

## 2013-11-02 LAB — TYPE AND SCREEN
ABO/RH(D): O POS
Antibody Screen: NEGATIVE
UNIT DIVISION: 0
Unit division: 0

## 2013-11-02 LAB — HEMOGLOBIN AND HEMATOCRIT, BLOOD
HEMATOCRIT: 26.5 % — AB (ref 39.0–52.0)
Hemoglobin: 8.9 g/dL — ABNORMAL LOW (ref 13.0–17.0)

## 2013-11-02 LAB — GLUCOSE, CAPILLARY
GLUCOSE-CAPILLARY: 145 mg/dL — AB (ref 70–99)
GLUCOSE-CAPILLARY: 161 mg/dL — AB (ref 70–99)
Glucose-Capillary: 132 mg/dL — ABNORMAL HIGH (ref 70–99)
Glucose-Capillary: 185 mg/dL — ABNORMAL HIGH (ref 70–99)

## 2013-11-02 MED ORDER — BETHANECHOL CHLORIDE 25 MG PO TABS
25.0000 mg | ORAL_TABLET | Freq: Three times a day (TID) | ORAL | Status: DC
  Administered 2013-11-02 – 2013-11-07 (×16): 25 mg via ORAL
  Filled 2013-11-02 (×19): qty 1

## 2013-11-02 NOTE — Plan of Care (Signed)
Problem: RH PAIN MANAGEMENT Goal: RH STG PAIN MANAGED AT OR BELOW PT'S PAIN GOAL Less than 2  Outcome: Not Progressing Patient rate pain at 5. adm
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
## 2013-11-02 NOTE — Progress Notes (Signed)
Occupational Therapy Session Note  Patient Details  Name: Anthony Hines MRN: [id number] Date of Birth: 1955/04/16  Today's Date: 11/02/2013 OT Individual Time: 4098-1191 OT Individual Time Calculation (min): 45 min    Skilled Therapeutic Interventions/Progress Updates:  Upon entering the room, pt seated in recliner chair with brother present. Pt reporting 8/10 pain in lower back with repositioning and ambulation pt reporting pain to "decrease a little." OT educated pt on AE for self care. OT demonstrate use of long handled reacher, sock aide, dressing stick, and discussed use of LH sponge. Pt returned demonstration with use of reacher to thread B pant legs with verbal cues for technique. Sit to stand x 3 this session with close supervision. Pt standing to pull up elastic waist pants with Min A for balance with RW. Pt utilized LH reacher to donn and doff shoes as push socks off. Pt returned demonstration of proper technique of how to place sock on aide and throw in front of foot but foot too wide for sock aide. Pt would benefit from wide sized sock aide. At conclusion of session, pt remained seated in recliner chair with call bell and all needed items within reach.   Therapy Documentation Precautions:  Precautions Precautions: Back;Fall Precaution Booklet Issued: Yes (comment) Precaution Comments: Pt able to verbalize 2 of 3 spinal precautions. Required Braces or Orthoses: Spinal Brace Spinal Brace: Thoracolumbosacral orthotic;Applied in sitting position Restrictions Weight Bearing Restrictions: No Pain: Pain Assessment Pain Score: 8  Pain Type: Acute pain Pain Location: Back Pain Orientation: Mid;Lower Pain Descriptors / Indicators: Aching Pain Onset: On-going Pain Intervention(s): Repositioning, rest  See FIM for current functional status  Therapy/Group: Individual Therapy  Lowella Grip 11/02/2013, 4:10 PM

## 2013-11-02 NOTE — Plan of Care (Signed)
Problem: SCI BLADDER ELIMINATION Goal: RH STG MANAGE BLADDER WITH ASSISTANCE STG Manage Bladder With Min Assistance  Outcome: Progressing Patient was able to go to bathroom and had 1 large incontinent episode

## 2013-11-02 NOTE — Plan of Care (Signed)
Problem: SCI BOWEL ELIMINATION Goal: RH STG MANAGE BOWEL WITH ASSISTANCE STG Manage Bowel with min Assistance.  Outcome: Progressing Pt had 2 large stools today

## 2013-11-03 ENCOUNTER — Inpatient Hospital Stay (HOSPITAL_COMMUNITY): Payer: Managed Care, Other (non HMO)

## 2013-11-03 ENCOUNTER — Inpatient Hospital Stay (HOSPITAL_COMMUNITY): Payer: Managed Care, Other (non HMO) | Admitting: *Deleted

## 2013-11-03 DIAGNOSIS — S22009A Unspecified fracture of unspecified thoracic vertebra, initial encounter for closed fracture: Secondary | ICD-10-CM

## 2013-11-03 DIAGNOSIS — S322XXA Fracture of coccyx, initial encounter for closed fracture: Secondary | ICD-10-CM

## 2013-11-03 DIAGNOSIS — I10 Essential (primary) hypertension: Secondary | ICD-10-CM

## 2013-11-03 DIAGNOSIS — S3210XA Unspecified fracture of sacrum, initial encounter for closed fracture: Secondary | ICD-10-CM

## 2013-11-03 LAB — GLUCOSE, CAPILLARY
GLUCOSE-CAPILLARY: 151 mg/dL — AB (ref 70–99)
Glucose-Capillary: 158 mg/dL — ABNORMAL HIGH (ref 70–99)
Glucose-Capillary: 215 mg/dL — ABNORMAL HIGH (ref 70–99)

## 2013-11-03 LAB — URINALYSIS, ROUTINE W REFLEX MICROSCOPIC
Bilirubin Urine: NEGATIVE
GLUCOSE, UA: NEGATIVE mg/dL
Hgb urine dipstick: NEGATIVE
KETONES UR: NEGATIVE mg/dL
LEUKOCYTES UA: NEGATIVE
Nitrite: NEGATIVE
Protein, ur: NEGATIVE mg/dL
Specific Gravity, Urine: 1.013 (ref 1.005–1.030)
Urobilinogen, UA: 1 mg/dL (ref 0.0–1.0)
pH: 7 (ref 5.0–8.0)

## 2013-11-03 NOTE — Care Management Note (Signed)
[facility] Individual Statement of Services  Patient Name:  Anthony Hines  Date:  11/02/2013  Welcome to the [facility].  Our goal is to provide you with an individualized program based on your diagnosis and situation, designed to meet your specific needs.  With this comprehensive rehabilitation program, you will be expected to participate in at least 3 hours of rehabilitation therapies Monday-Friday, with modified therapy programming on the weekends.  Your rehabilitation program will include the following services:  Physical Therapy (PT), Occupational Therapy (OT), 24 hour per day rehabilitation nursing, Therapeutic Recreaction (TR), Case Management (Social Worker), Rehabilitation Medicine, Nutrition Services and Pharmacy Services  Weekly team conferences will be held on Tuesdays to discuss your progress.  Your Social Worker will talk with you frequently to get your input and to update you on team discussions.  Team conferences with you and your family in attendance may also be held.  Expected length of stay: 21 days  Overall anticipated outcome: supervision   Depending on your progress and recovery, your program may change. Your Social Worker will coordinate services and will keep you informed of any changes. Your Social Worker's name and contact numbers are listed  below.  The following services may also be recommended but are not provided by the [facility]:   Driving Evaluations  Home Health Rehabiltiation Services  Outpatient Rehabilitation Services  Vocational Rehabilitation   Arrangements will be made to provide these services after discharge if needed.  Arrangements include referral to agencies that provide these services.  Your insurance has been verified to be:  [place] Your primary doctor is:  Dr. Shary Decamp  Pertinent information will be shared with your doctor and your insurance company.  Social Worker:  [place], [place]  [phone] or [phone]   Information discussed with and copy given to patient by: Amada Jupiter, 11/02/2013, 12:47 PM
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
## 2013-11-03 NOTE — Progress Notes (Signed)
Physical Therapy Session Note  Patient Details  Name: Anthony Hines MRN: [id number] Date of Birth: April 17, 1955  Today's Date: 11/03/2013 PT Individual Time: 0800-0900 PT Individual Time Calculation (min): 60 min  Session 2 Time: 1430-1500 Time Calculation (min) 30 min   Short Term Goals: Week 1:  PT Short Term Goal 1 (Week 1): Pt will perform supine<>sit with mod A and min cueing for adherence to spinal precautions. PT Short Term Goal 2 (Week 1): Pt will consistently perform bed<>w/c transfer with min A. PT Short Term Goal 3 (Week 1): Pt will perform gait x30' in controlled environment with min A using LRAD. PT Short Term Goal 4 (Week 1): Pt will effectly demonstrate pressure relief technique without assistance.  Skilled Therapeutic Interventions/Progress Updates:    Session 1: Pt received supine in bed, agreeable to participate in therapy. Pt identified 3/3 back precautions w/ no cueing. Pt reported continued discomfort w/ TLSO yesterday despite adjustments by [employer]. Pt agreed to try donning TLSO in supine instead of seated EOB for better fit. Pt rolled L/R w/ MinA to maintain precautions for therapist to don TLSO. Supine>sit w/ minA, pt reported improved fit for TLSO and increased comfort vs yesterday. TotalA to don shoes while seated EOB. Pt w/ SPT w/ RW bed>w/c w/ MinGuard A. Pt propelled w/c 150' to rehab gym, ambulated 62'  Then 50' w/ RW in controlled environment w/ MinA. 2x5 sit<>stands at edge of mat table w/ extended rest break in between and some reports of lightheadedness (VS normal) which dissipated w/ rest. Negotiated up/down 5 stairs x2 w/ MinGuard for safety and min VC's for sequencing and hand placement. PT instructed pt to use 1 rail in order to simulate home entrance, pt able to maintain 1 rail ~50% of time. Pt ambulated 150' w/ RW back to room w/ MinGuard and occasional close S. Pt left seated in w/c w/ all needs within reach.  Session 2: Pt received seated in w/c, agreeable to  participate in therapy. Reporting significantly increased fatigue and rib pain since AM session. Pt propelled w/c 150' w/ S to rehab gym. Ambulated 15' w/ RW and MinGuard to Nustep. Completed 10' on L2-L3 w/ LE only for endurance and BLE strengthening. Ambulated 10' to mat table, performed isometric hip adduction x10 and standing mini-squats (stand<>half-sit) at edge of mat x10. Ambulated 20' back to w/c w/ RW and MinGuard, propelled w/c 150' back to room w/ S. Pt left seated in w/ c w/ all needs within reach.   Therapy Documentation Precautions:  Precautions Precautions: Back;Fall Precaution Booklet Issued: Yes (comment) Precaution Comments: Pt able to verbalize 2 of 3 spinal precautions. Required Braces or Orthoses: Spinal Brace Spinal Brace: Thoracolumbosacral orthotic;Applied in sitting position Restrictions Weight Bearing Restrictions: No General:   Vital Signs:   Pain: Pain Assessment Pain Assessment: 0-10 Pain Score: 2  Pain Type: Surgical pain Pain Location: Back Pain Orientation: Mid;Lower Mobility:   Locomotion : Ambulation Ambulation/Gait Assistance: 4: Min assist;4: Min guard  Trunk/Postural Assessment :    Balance:   Exercises:   Other Treatments:    See FIM for current functional status  Therapy/Group: Individual Therapy  Hosie Spangle Hosie Spangle, PT, DPT 11/03/2013, 12:09 PM
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
## 2013-11-03 NOTE — Plan of Care (Signed)
Problem: RH PAIN MANAGEMENT Goal: RH STG PAIN MANAGED AT OR BELOW PT'S PAIN GOAL Less than 2  Outcome: Not Progressing Patient rates pain at 6

## 2013-11-03 NOTE — Progress Notes (Signed)
Occupational Therapy Session Note  Patient Details  Name: Anthony Hines MRN: [id number] Date of Birth: 07-11-1955  Today's Date: 11/03/2013 OT Individual Time: 1300-1330 OT Individual Time Calculation (min): 30 min    Skilled Therapeutic Interventions/Progress Updates:   Today's Date: 11/03/2013 OT Individual Time: 1000-1100  (60 min)  1st session OT Individual Time Calculation (min): 60 min )   1.  OT educated pt on AE for self care. OT demonstrate use of long handled reacher, sock aide, dressing stick, and discussed use of LH sponge. Pt returned demonstration with use of reacher to thread B pant legs with verbal cues for technique. Sit to stand x 3 this session with close supervision. Pt standing to pull up elastic waist pants with Min A for balance with RW. Pt utilized LH reacher to donn and doff shoes and push socks off.  Practiced with Extra large sock aid and pt donned and doffed socks.    Therapy Documentation Precautions:  Precautions Precautions: Back;Fall Precaution Booklet Issued: Yes (comment) Precaution Comments: Pt able to verbalize 2 of 3 spinal precautions. Required Braces or Orthoses: Spinal Brace Spinal Brace: Thoracolumbosacral orthotic;Applied in sitting position Restrictions Weight Bearing Restrictions: No       Pain: Pain Assessment Pain Assessment: 0-10 Pain Score: 2  Pain Type: Surgical pain Pain Location: Back Pain Orientation: Mid;Lower Pain Descriptors / Indicators: Aching;Sore Pain Frequency: Constant Pain Onset: On-going Patients Stated Pain Goal: 2 Pain Intervention(s): Medication (See eMAR)       Other Treatments:   Today's Date: 11/03/2013  2nd session OT Individual Time: 1300-1330 OT Individual Time Calculation (min): 30 min  2.  Pt. Propelled wc down to gym with no problems.  Used Insurance risk surveyor for 5 minutes x2 reps at [employer] in wc position.  Pt. Kept RPM at @ 50.  Provided elastic shoe laces and pt donned right shoe but needed minimal assist  with left.  Needs reinforcement and practice.  Pt. Obtained AE kit from gift shop and exchanged small sock aid for large.    See FIM for current functional status  Therapy/Group: Individual Therapy  Lisa Roca 11/03/2013, 6:16 PM

## 2013-11-04 ENCOUNTER — Inpatient Hospital Stay (HOSPITAL_COMMUNITY): Payer: Managed Care, Other (non HMO)

## 2013-11-04 ENCOUNTER — Inpatient Hospital Stay (HOSPITAL_COMMUNITY): Payer: Managed Care, Other (non HMO) | Admitting: Occupational Therapy

## 2013-11-04 LAB — GLUCOSE, CAPILLARY
GLUCOSE-CAPILLARY: 183 mg/dL — AB (ref 70–99)
GLUCOSE-CAPILLARY: 206 mg/dL — AB (ref 70–99)
Glucose-Capillary: 126 mg/dL — ABNORMAL HIGH (ref 70–99)
Glucose-Capillary: 140 mg/dL — ABNORMAL HIGH (ref 70–99)

## 2013-11-04 LAB — URINE CULTURE
Colony Count: NO GROWTH
Culture: NO GROWTH

## 2013-11-04 LAB — HEMOGLOBIN AND HEMATOCRIT, BLOOD
HCT: 27.6 % — ABNORMAL LOW (ref 39.0–52.0)
Hemoglobin: 9.3 g/dL — ABNORMAL LOW (ref 13.0–17.0)

## 2013-11-04 NOTE — Progress Notes (Signed)
Occupational Therapy Session Note  Patient Details  Name: Anthony Hines MRN: [id number] Date of Birth: Mar 22, 1955  Today's Date: 11/04/2013 OT Individual Time: 0930-1015 OT Individual Time Calculation (min): 45 min   Short Term Goals: Week 1:  OT Short Term Goal 1 (Week 1): Bath: Mod assist to include stand to wash peri area. OT Short Term Goal 2 (Week 1): UB dressing: Min assist with pull over shirt. OT Short Term Goal 3 (Week 1): LB dressing: Mod assist to include sit and stand and use of AE PRN OT Short Term Goal 4 (Week 1): Toileting: Mod assist (2/3 tasks) OT Short Term Goal 5 (Week 1): Toilet transfer mod assist with stand step  Skilled Therapeutic Interventions/Progress Updates:  Patient resting in w/c with brother visiting.  TLSO appears to be fitting patient much better after orthotist changed out the front portion of the brace.  Engaged in self care retraining to include bathing dressing toilet transfer and toileting.  Focused session on use of AE, activity tolerance, adaptive techniques to bath and dress.  Patient did not recall ever working with this clinician.  This OT had evaluated patient 4 days earlier.  Reviewed with patient that he relies heavily on grab bar during toilet transfer and required cues not to bend forward during transitional movements.  Patient describes what appears to be a 3 in 1 commode that his mother used and he thinks it will fit over his commode at home to provide seat to floor height and arm rests to assist with sit><stand.   Therapy Documentation Precautions:  Precautions Precautions: Back;Fall Precaution Booklet Issued: Yes (comment) Precaution Comments: Pt able to verbalize 2 of 3 spinal precautions. Required Braces or Orthoses: Spinal Brace Spinal Brace: Thoracolumbosacral orthotic;Applied in sitting position Restrictions Weight Bearing Restrictions: No Pain: 2/10 in back and arms, premedicated, rest and repositioned ADL: See FIM for current  functional status  Therapy/Group: Individual Therapy  Victorious Cosio 11/04/2013, 7:50 AM
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
## 2013-11-04 NOTE — Progress Notes (Signed)
Physical Therapy Session Note  Patient Details  Name: Anthony Hines MRN: [id number] Date of Birth: Apr 18, 1955  Today's Date: 11/04/2013 PT Individual Time: 4098-1191; Treatment Session 2: 1100-1200; Treatment Session 3: 1300-1330 PT Individual Time Calculation (min): 45 min; Treatment Session 2: ; Treatment Session 3:  Short Term Goals: Week 1:  PT Short Term Goal 1 (Week 1): Pt will perform supine<>sit with mod A and min cueing for adherence to spinal precautions. PT Short Term Goal 2 (Week 1): Pt will consistently perform bed<>w/c transfer with min A. PT Short Term Goal 3 (Week 1): Pt will perform gait x30' in controlled environment with min A using LRAD. PT Short Term Goal 4 (Week 1): Pt will effectly demonstrate pressure relief technique without assistance.  Skilled Therapeutic Interventions/Progress Updates:  Treatment Session 1:  1:1. Pt received sitting on toilet, w/out TLSO on. Pt and nurse tech re-educated that TLSO to be donned in supine and on at all times when OOB. Pt req assist for pericare. Focus this session on activity tolerance, functional ambulation and w/c propulsion, bed mobility and furniture transfers. Pt reporting 2/10 pain, received pain meds prior to session. Pt propelled w/c 225'x1 w/ B UE/LE w/ good pace, supervision overall. Pt practiced t/f L sidelying<>sit EOB w/ use of log roll technique 2x, 1x w/ min A and 2x w/ supervision. Mod cueing for intellectual and emergent awareness regarding need for TLSO and precautions, including need for assistance from wife to don TLSO. Noted delayed processing overall. Pt req supervision for performance of multiple furniture transfers to various surfaces +/- arm rests and seat heights w/ min cues for safety and hand placement. Pt req close(S) for ambulation 15'x2 and 25'x2 in home environment as well as 225'x1 w/ RW in controlled environment. Pt req cues for breathing during all standing mobility and req seated rest throughout  session. Pt reported 5/10 pain at end of session, RN made aware. Pt left sitting in w/c at end of session w/ all needs in reach, brother in room.    Treatment Session 2:  1:1. Pt received sitting on toilet, w/ TLSO. Pt req assist for pericare. Supervision for t/f toilet>w/c. Pt reporting 3/10 pain in back at start of session, RN present to provide pain medicine. Focus this session on activity tolerance, functional ambulation and w/c propulsion, stair negotiation and car t/f training. Pt propelled w/c 200'x1 w/ supervision. Pt practiced ambulation in community environment, 150-200'x4 w/ close(S)-min A including on/off elevators, in busy hospital lobby, outside on uneven surfaces. Pt amb 250'x2 on unity w/ RW and supervision. Pt verbalizes feeling limited by endurance during standing mobility>pain. Pt req close(S) for negotiation up/down 8 steps w/ B UE on single rail, min cues for technique and safety and supervision for safe performance of car t/f w/ min cues. Pt req seated rest throughout session due to fatigue and pain. Pt left sitting in w/c at end of session w/ all needs in reach, brother in room.   Treatment Session 3:  1:1. Pt received sitting in w/c, ready for therapy. Focus this session on functional ambulation and therex. Pt reporting 3/10 pain in back, RN present to provide pain medicine. Pt amb 225'x2 w/ RW and close(S), pt demonstrating steady pace and intermittent cues for posture. Pt utilized NuStep for B LE strength/endurance, level 4x55min w/ overall good tolerance. Did not use B UE due to back precautions. Pt left sitting in w/c at end of session w/ all needs in reach, brother in room.  Therapy Documentation Precautions:  Precautions Precautions: Back;Fall Precaution Booklet Issued: Yes (comment) Precaution Comments: Pt able to verbalize 2 of 3 spinal precautions. Required Braces or Orthoses: Spinal Brace Spinal Brace: Thoracolumbosacral orthotic;Applied in sitting  position Restrictions Weight Bearing Restrictions: No   Pain: Pain Assessment Pain Assessment: 0-10 Pain Score: 3   See FIM for current functional status  Therapy/Group: Individual Therapy  Denzil Hughes 11/04/2013, 1:16 PM

## 2013-11-04 NOTE — Plan of Care (Signed)
Problem: RH Balance Goal: LTG Patient will maintain dynamic standing balance (PT) LTG: Patient will maintain dynamic standing balance with assistance during mobility activities (PT)  Upgraded due to increased endurance, balance and strength.   Problem: RH Bed Mobility Goal: LTG Patient will perform bed mobility with assist (PT) LTG: Patient will perform bed mobility with assistance, with/without cues (PT).  Upgraded due to increased endurance, balance and strength.   Problem: RH Bed to Chair Transfers Goal: LTG Patient will perform bed/chair transfers w/assist (PT) LTG: Patient will perform bed/chair transfers with assistance, with/without cues (PT).  Upgraded due to increased endurance, balance and strength.   Problem: RH Furniture Transfers Goal: LTG Patient will perform furniture transfers w/assist (OT/PT LTG: Patient will perform furniture transfers with assistance (OT/PT).  Upgraded due to increased endurance, balance and strength.   Problem: RH Ambulation Goal: LTG Patient will ambulate in controlled environment (PT) LTG: Patient will ambulate in a controlled environment, # of feet with assistance (PT).  Upgraded due to increased endurance, balance and strength.  Goal: LTG Patient will ambulate in home environment (PT) LTG: Patient will ambulate in home environment, # of feet with assistance (PT).  Upgraded due to increased endurance, balance and strength.   Problem: RH Wheelchair Mobility Goal: LTG Patient will propel w/c in controlled environment (PT) LTG: Patient will propel wheelchair in controlled environment, # of feet with assist (PT)  Upgraded due to increased endurance, balance and strength.  Goal: LTG Patient will propel w/c in home environment (PT) LTG: Patient will propel wheelchair in home environment, # of feet with assistance (PT).  Outcome: Not Applicable Date Met:  32/95/18 Goal D/C, anticipate that pt will be at household amb level at time of d/c.  Goal:  LTG Patient will propel w/c in community environment (PT) LTG: Patient will propel wheelchair in community environment, # of feet with assist (PT)  Outcome: Not Applicable Date Met:  84/16/60 Goal d/c, anticipate that pt will be able to tolerate limited community ambulation upon d/c.   Problem: RH Stairs Goal: LTG Patient will ambulate up and down stairs w/assist (PT) LTG: Patient will ambulate up and down # of stairs with assistance (PT)  Upgraded due to increased endurance, balance and strength.

## 2013-11-05 ENCOUNTER — Inpatient Hospital Stay (HOSPITAL_COMMUNITY): Payer: Managed Care, Other (non HMO)

## 2013-11-05 ENCOUNTER — Encounter (HOSPITAL_COMMUNITY): Payer: Managed Care, Other (non HMO) | Admitting: Occupational Therapy

## 2013-11-05 LAB — CBC
HCT: 28.9 % — ABNORMAL LOW (ref 39.0–52.0)
Hemoglobin: 9.7 g/dL — ABNORMAL LOW (ref 13.0–17.0)
MCH: 32.2 pg (ref 26.0–34.0)
MCHC: 33.6 g/dL (ref 30.0–36.0)
MCV: 96 fL (ref 78.0–100.0)
PLATELETS: 354 10*3/uL (ref 150–400)
RBC: 3.01 MIL/uL — AB (ref 4.22–5.81)
RDW: 13.9 % (ref 11.5–15.5)
WBC: 12.1 10*3/uL — AB (ref 4.0–10.5)

## 2013-11-05 LAB — GLUCOSE, CAPILLARY
GLUCOSE-CAPILLARY: 156 mg/dL — AB (ref 70–99)
GLUCOSE-CAPILLARY: 166 mg/dL — AB (ref 70–99)
GLUCOSE-CAPILLARY: 178 mg/dL — AB (ref 70–99)
Glucose-Capillary: 151 mg/dL — ABNORMAL HIGH (ref 70–99)

## 2013-11-05 NOTE — Progress Notes (Signed)
Physical Therapy Session Note  Patient Details  Name: Anthony Hines MRN: [id number] Date of Birth: 07-10-1955  Today's Date: 11/05/2013 PT Individual Time: Treatment Session 1: 0930-1030; Treatment Session 2: 1300-1400 PT Individual Time Calculation (min): Treatment Session 1: 60 min ; Treatment Session 2:  Short Term Goals: Week 1:  PT Short Term Goal 1 (Week 1): Pt will perform supine<>sit with mod A and min cueing for adherence to spinal precautions. PT Short Term Goal 2 (Week 1): Pt will consistently perform bed<>w/c transfer with min A. PT Short Term Goal 3 (Week 1): Pt will perform gait x30' in controlled environment with min A using LRAD. PT Short Term Goal 4 (Week 1): Pt will effectly demonstrate pressure relief technique without assistance.  Skilled Therapeutic Interventions/Progress Updates:  Treatment Session 1:  1:1. Pt received sitting in w/c, ready for therapy. Focus this session on functional endurance, bed mobility, TLSO management, ambulation and therex. Pt amb 225'x1, 100'x1 and 175'x1 w/ RW and distant supervision w/ min cues for posture. Trial use of SPC, pt amb 75-100'x2 w/ min A and initial cues for technique. Antalgic gait exacerbated w/ use of SPC with mild LOBs, pt demonstrating overall decreased tolerance. Continued recommendation for use of RW at this time due to increased functional independence and decreased pain, pt in agreement. Pt req supervision for t/f sup<>sit in standard bed w/ initial min cueing for emergent awareness of back precautions. Pt sitting EOB to practice donning/doffing TLSO w/ mod A and mod cues for seq. Straps numbered for improved ease of donning. Pt utilized NuStep for B LE strength/endurance, level 4x10 and cues to keep pace in 50s. Pt left sitting in w/c at end of session w/ all needs in reach.   Treatment Session 2:  1:1. Pt received sitting in w/c, ready for therapy. Pt's wife present to participate in family training. Pt req distant  supervision by therapist during bed mobility, furniture transfers and ambulation in controlled/home environments w/ RW multiple bouts >150' at this time w/ min cues for safety and emergent awareness of precautions. Anticipate that pt will be able to safely perform these tasks at Mod(I) level by d/c. Pt cued for increased WB through B LE vs. UE during ambulation. Pt's wife instructed on and successfully assisting pt with donning/doffing TLSO while sitting EOB. Pt's wife providing safe supervision during pt negotiation up/down 5 steps w/ single rail, car t/f and ambulation in community environment w/ RW 250-300'x3 bouts as well as negotiation up/down curb step w/ RW. Pt and wife educated on overall safety in home environment, fall prevention, back precautions, need for TLSO and goals for HHPT. Pt left sitting in w/c at end of session w/ all needs in reach and wife in room.   Therapy Documentation Precautions:  Precautions Precautions: Back;Fall Precaution Booklet Issued: Yes (comment) Precaution Comments: Pt able to verbalize 2 of 3 spinal precautions. Required Braces or Orthoses: Spinal Brace Spinal Brace: Thoracolumbosacral orthotic;Applied in sitting position Restrictions Weight Bearing Restrictions: No  Pain: Pain Assessment Pain Score: 0-No pain Ambulation Ambulation/Gait Assistance: 5: Supervision   See FIM for current functional status  Therapy/Group: Individual Therapy  Denzil Hughes 11/05/2013, 3:13 PM
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
## 2013-11-05 NOTE — Progress Notes (Signed)
Occupational Therapy Session Note  Patient Details  Name: Anthony Hines MRN: [id number] Date of Birth: Aug 25, 1955  Today's Date: 11/05/2013 OT Individual Time: 0800-0900 OT Individual Time Calculation (min): 60 min    Short Term Goals: Week 1:  OT Short Term Goal 1 (Week 1): Bath: Mod assist to include stand to wash peri area. OT Short Term Goal 2 (Week 1): UB dressing: Min assist with pull over shirt. OT Short Term Goal 3 (Week 1): LB dressing: Mod assist to include sit and stand and use of AE PRN OT Short Term Goal 4 (Week 1): Toileting: Mod assist (2/3 tasks) OT Short Term Goal 5 (Week 1): Toilet transfer mod assist with stand step Week 2:     Skilled Therapeutic Interventions/Progress Updates:    1:1 self care retraining at EOB with focus on bed mobility to come to EOB from flat bed without bed rails, sit to stand, LB bathing and dressing with use of AE , standing balance and tolerance, dynamic movement in standing to complete bathing with min cuing to avoid twisting, short distance functional ambulation with distant supervision, etc. Pt with continued decr activity tolerance required frequent rest breaks; pt with heavy breathing with prolonged standing activity.  Discussed and education on donning and doffing TLSO, home management tasks and goal of toileting with decr assistance.   Therapy Documentation Precautions:  Precautions Precautions: Back;Fall Precaution Booklet Issued: Yes (comment) Precaution Comments: Pt able to verbalize 2 of 3 spinal precautions. Required Braces or Orthoses: Spinal Brace Spinal Brace: Thoracolumbosacral orthotic;Applied in sitting position Restrictions Weight Bearing Restrictions: No Pain: Pain Assessment Pain Assessment: 0-10 Pain Score: 0-No pain   See FIM for current functional status  Therapy/Group: Individual Therapy  Roney Mans [facility] 11/05/2013, 12:14 PM

## 2013-11-06 ENCOUNTER — Encounter (HOSPITAL_COMMUNITY): Payer: Managed Care, Other (non HMO) | Admitting: Occupational Therapy

## 2013-11-06 ENCOUNTER — Inpatient Hospital Stay (HOSPITAL_COMMUNITY): Payer: Managed Care, Other (non HMO)

## 2013-11-06 ENCOUNTER — Inpatient Hospital Stay (HOSPITAL_COMMUNITY): Payer: Managed Care, Other (non HMO) | Admitting: Physical Therapy

## 2013-11-06 LAB — GLUCOSE, CAPILLARY
GLUCOSE-CAPILLARY: 121 mg/dL — AB (ref 70–99)
Glucose-Capillary: 134 mg/dL — ABNORMAL HIGH (ref 70–99)
Glucose-Capillary: 188 mg/dL — ABNORMAL HIGH (ref 70–99)
Glucose-Capillary: 190 mg/dL — ABNORMAL HIGH (ref 70–99)

## 2013-11-06 NOTE — Progress Notes (Signed)
Occupational Therapy Session Note  Patient Details  Name: Anthony Hines MRN: [id number] Date of Birth: 09-08-1955  Today's Date: 11/06/2013 OT Individual Time: 0800-0900 OT Individual Time Calculation (min): 60 min    Short Term Goals: Week 1:  OT Short Term Goal 1 (Week 1): Bath: Mod assist to include stand to wash peri area. OT Short Term Goal 2 (Week 1): UB dressing: Min assist with pull over shirt. OT Short Term Goal 3 (Week 1): LB dressing: Mod assist to include sit and stand and use of AE PRN OT Short Term Goal 4 (Week 1): Toileting: Mod assist (2/3 tasks) OT Short Term Goal 5 (Week 1): Toilet transfer mod assist with stand step  Skilled Therapeutic Interventions/Progress Updates:    1:1 self care retraining including bathing and dressing at EOB.  Pt declined shower today due to nursing just changed all bandages prior to session. Pt required increased time today to come from supine to EOB but was able to perform with supervision with min verbal cues to remain in log position. Pt demonstrated good recall for safe use of walker for standing and functional ambulation. Pt with improving activity tolerance. Pt stood to perform grooming at mod I with use of RW. Pt used tongs for bottom hygiene/ bathing today with extra time but was successful. Pt able to gather dirty clothes and put clothes in laundry drawer with reacher and RW at ambulatory level.   Therapy Documentation Precautions:  Precautions Precautions: Back;Fall Precaution Booklet Issued: Yes (comment) Precaution Comments: Pt able to verbalize 2 of 3 spinal precautions. Required Braces or Orthoses: Spinal Brace Spinal Brace: Thoracolumbosacral orthotic;Applied in sitting position Restrictions Weight Bearing Restrictions: No Pain: Premedicated  No c/o pain in session   See FIM for current functional status  Therapy/Group: Individual Therapy  Roney Mans [facility] [place] 11/06/2013, 8:19 AM
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
## 2013-11-06 NOTE — Progress Notes (Signed)
Physical Therapy Session Note  Patient Details  Name: Anthony Hines MRN: [id number] Date of Birth: 04-Nov-1955  Today's Date: 11/06/2013 PT Individual Time: 1100-1200 PT Individual Time Calculation (min): 60 min   Short Term Goals: Week 1:  PT Short Term Goal 1 (Week 1): Pt will perform supine<>sit with mod A and min cueing for adherence to spinal precautions. PT Short Term Goal 2 (Week 1): Pt will consistently perform bed<>w/c transfer with min A. PT Short Term Goal 3 (Week 1): Pt will perform gait x30' in controlled environment with min A using LRAD. PT Short Term Goal 4 (Week 1): Pt will effectly demonstrate pressure relief technique without assistance.  Skilled Therapeutic Interventions/Progress Updates:    Gait Training: PT instructs pt in ambulation with RW req SBA, including head turns up/down and R/L x 300' - no LOB noted.  PT instructs pt in ambulation with SPC x 150' x 2 req CGA for safety and verbal cues for 3 point gait, progressing to 2 point gait, specifically for pt to take a smaller step with the [facility]. On second set, pt demonstrates 2 LOB req assist to prevent a fall.  PT instructs pt in ambulation on stairs with 1 rail and SPC req CGA x 10 stairs.    Neuromuscular Reeducation: PT administers the [employer] (omitting the item where pt is supposed to twist his spine to look over his shoulder) and pt scores 40, indicating significant risk of falling. PT explained to pt that this means he must walk with an assistive device.   Pt is progressing in balance skills and ability to ambulate with a lesser restrictive assistive device, but when pt becomes fatigued, his balance and safety awareness degrade significantly. Pt will likely be safest to d/c home with a RW on anticipated d/c date of 9/11. Continue per PT POC.   Therapy Documentation Precautions:  Precautions Precautions: Back;Fall Precaution Booklet Issued: Yes (comment) Precaution Comments: Pt able to verbalize 2 of 3  spinal precautions. Required Braces or Orthoses: Spinal Brace Spinal Brace: Thoracolumbosacral orthotic;Applied in sitting position Restrictions Weight Bearing Restrictions: No Pain: Pain Assessment Pain Assessment: 0-10 Pain Score: 3  Pain Type: Acute pain Pain Location: Back Pain Orientation: Lower Pain Descriptors / Indicators: Aching Pain Frequency: Constant Pain Onset: On-going Pain Intervention(s): Rest Multiple Pain Sites: No  Balance: Balance Balance Assessed: Yes Standardized Balance Assessment Standardized Balance Assessment: Berg Balance Test Berg Balance Test Sit to Stand: Able to stand  independently using hands Standing Unsupported: Able to stand safely 2 minutes Sitting with Back Unsupported but Feet Supported on Floor or Stool: Able to sit safely and securely 2 minutes Stand to Sit: Sits safely with minimal use of hands Transfers: Able to transfer safely, definite need of hands Standing Unsupported with Eyes Closed: Able to stand 10 seconds safely Standing Ubsupported with Feet Together: Able to place feet together independently and stand 1 minute safely From Standing, Reach Forward with Outstretched Arm: Can reach forward >12 cm safely (5") From Standing Position, Pick up Object from Floor: Unable to pick up and needs supervision From Standing Position, Turn to Look Behind Over each Shoulder: Needs assist to keep from losing balance and falling (spinal precautions) Turn 360 Degrees: Able to turn 360 degrees safely but slowly Standing Unsupported, Alternately Place Feet on Step/Stool: Able to stand independently and safely and complete 8 steps in 20 seconds Standing Unsupported, One Foot in Front: Able to plae foot ahead of the other independently and hold 30 seconds Standing  on One Leg: Tries to lift leg/unable to hold 3 seconds but remains standing independently Total Score: 40  See FIM for current functional status  Therapy/Group: Individual  Therapy  Anthony Hines M 11/06/2013, 11:04 AM

## 2013-11-06 NOTE — Progress Notes (Signed)
Physical Therapy Session Note  Patient Details  Name: Anthony Hines MRN: [id number] Date of Birth: 02-Nov-1955  Today's Date: 11/06/2013 PT Individual Time: 1300-1400 PT Individual Time Calculation (min): 60 min   Short Term Goals: Week 1:  PT Short Term Goal 1 (Week 1): Pt will perform supine<>sit with mod A and min cueing for adherence to spinal precautions. PT Short Term Goal 2 (Week 1): Pt will consistently perform bed<>w/c transfer with min A. PT Short Term Goal 3 (Week 1): Pt will perform gait x30' in controlled environment with min A using LRAD. PT Short Term Goal 4 (Week 1): Pt will effectly demonstrate pressure relief technique without assistance.  Skilled Therapeutic Interventions/Progress Updates:    Pt received seated in w/c, agreeable to participate in therapy. Pt ambulated 150' to rehab gym w/ RW and S. Pt accurately described therapy sessions this AM, also correctly described use of RW vs SPC, observations matched therapist's note. Pt completed L4 10' on Nustep w/ LE only for BLE strengthening and endurance. Pt ambulated w/ RW 100' to ortho gym, performed x2 car transfers w/ supervision, then negotiated up/down 9 steps w/ rail on L going up w/ side step technique w/ supervision. Ambulated to ADL apartment, performed bed mobility blocked practice supine <> sit x5 w/ supervision, then sit<>stands from EOB w/ RW x10 w/ supervision. Pt completed sit<>stand from couch in ADL apartment w/ supervision. In rehab gym pt performed 5' shooting basketball from standing w/ close S and intermittent distant S, no LOB noted. 2' throwing basketball at rebounder for dynamic standing balance and anticipatory postural reactions. Pt benefited from close S but no LOB noted. Pt ambulated w/ RW 150' back to room w/ S, left seated in w/c w/ all needs within reach.   Therapy Documentation Precautions:  Precautions Precautions: Back;Fall Precaution Booklet Issued: Yes (comment) Precaution Comments: Pt able to  verbalize 2 of 3 spinal precautions. Required Braces or Orthoses: Spinal Brace Spinal Brace: Thoracolumbosacral orthotic;Applied in sitting position Restrictions Weight Bearing Restrictions: No General:   Vital Signs: Therapy Vitals Pulse Rate: 86 Resp: 17 BP: 128/73 mmHg Oxygen Therapy SpO2: 99 % O2 Device: None (Room air) Pain: Pain Assessment Pain Score: 3  Mobility:   Locomotion : Ambulation Ambulation/Gait Assistance: 4: Min guard  Trunk/Postural Assessment :    Balance:   Exercises:   Other Treatments:    See FIM for current functional status  Therapy/Group: Individual Therapy  Hosie Spangle Hosie Spangle, PT, DPT 11/06/2013, 3:39 PM

## 2013-11-07 ENCOUNTER — Inpatient Hospital Stay (HOSPITAL_COMMUNITY): Payer: Managed Care, Other (non HMO) | Admitting: Occupational Therapy

## 2013-11-07 ENCOUNTER — Inpatient Hospital Stay (HOSPITAL_COMMUNITY): Payer: Managed Care, Other (non HMO)

## 2013-11-07 LAB — GLUCOSE, CAPILLARY
GLUCOSE-CAPILLARY: 138 mg/dL — AB (ref 70–99)
GLUCOSE-CAPILLARY: 155 mg/dL — AB (ref 70–99)
GLUCOSE-CAPILLARY: 191 mg/dL — AB (ref 70–99)
Glucose-Capillary: 147 mg/dL — ABNORMAL HIGH (ref 70–99)

## 2013-11-07 IMAGING — CR DG ANKLE 2V *L*
2 series · 2 of 2 positions shown · non-contrast
Comparison: None.

CLINICAL DATA: Pain post trauma

EXAM:
LEFT ANKLE - 2 VIEW

[x ankle ap left]
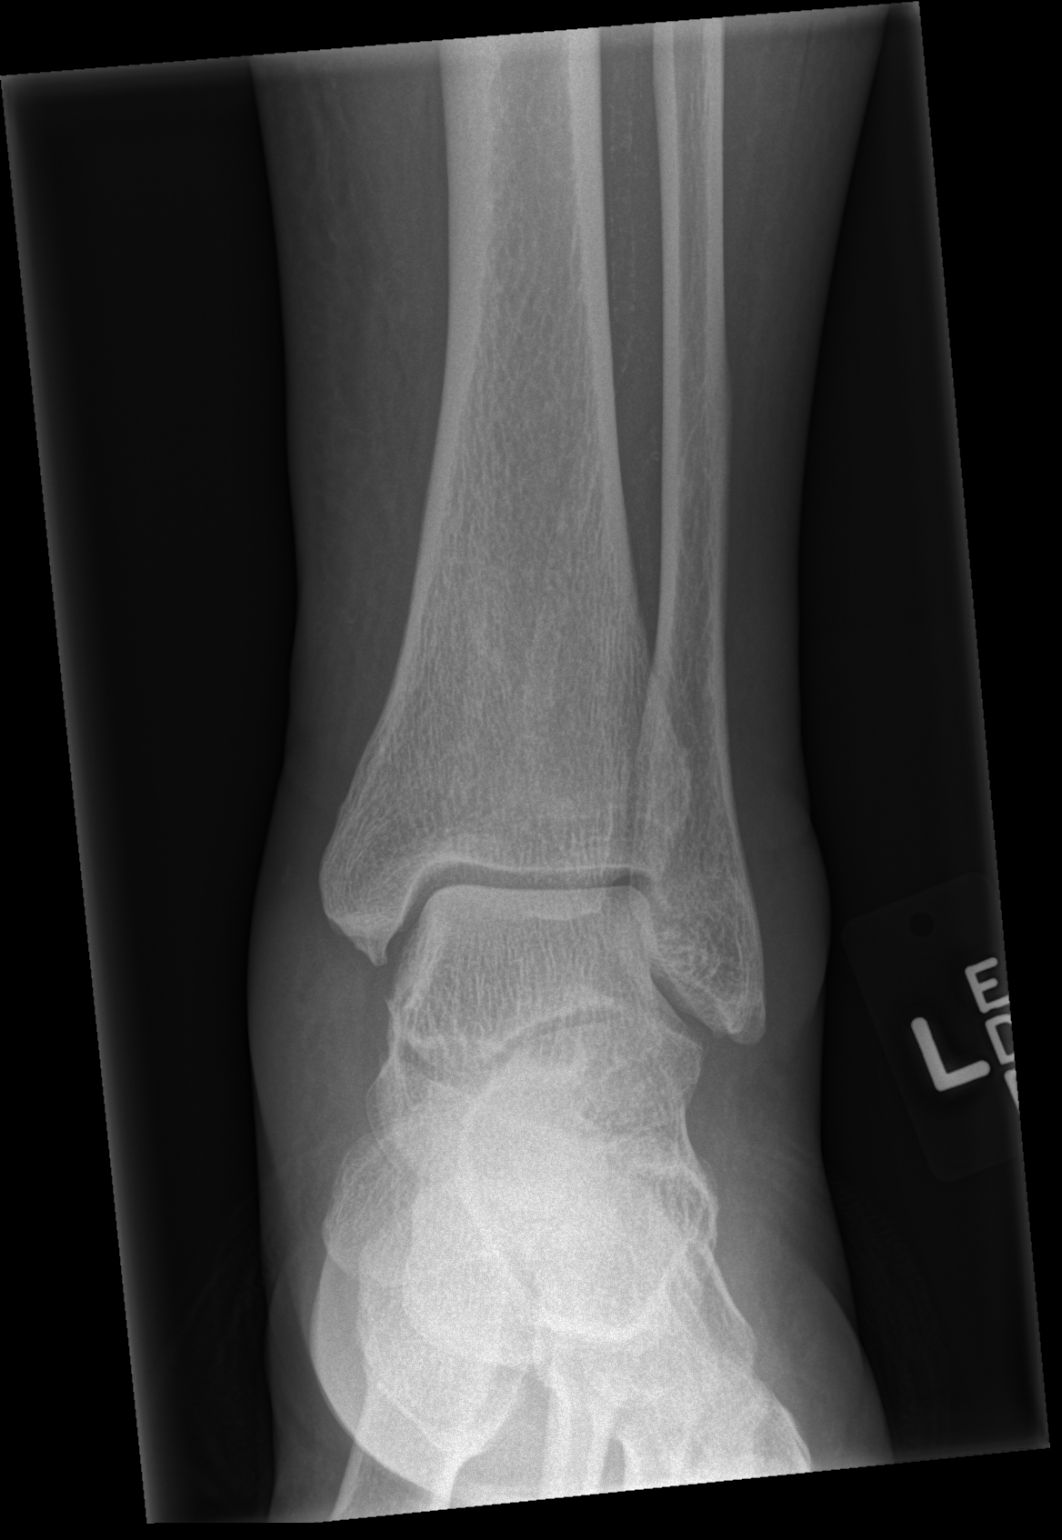

[x ankle lat left]
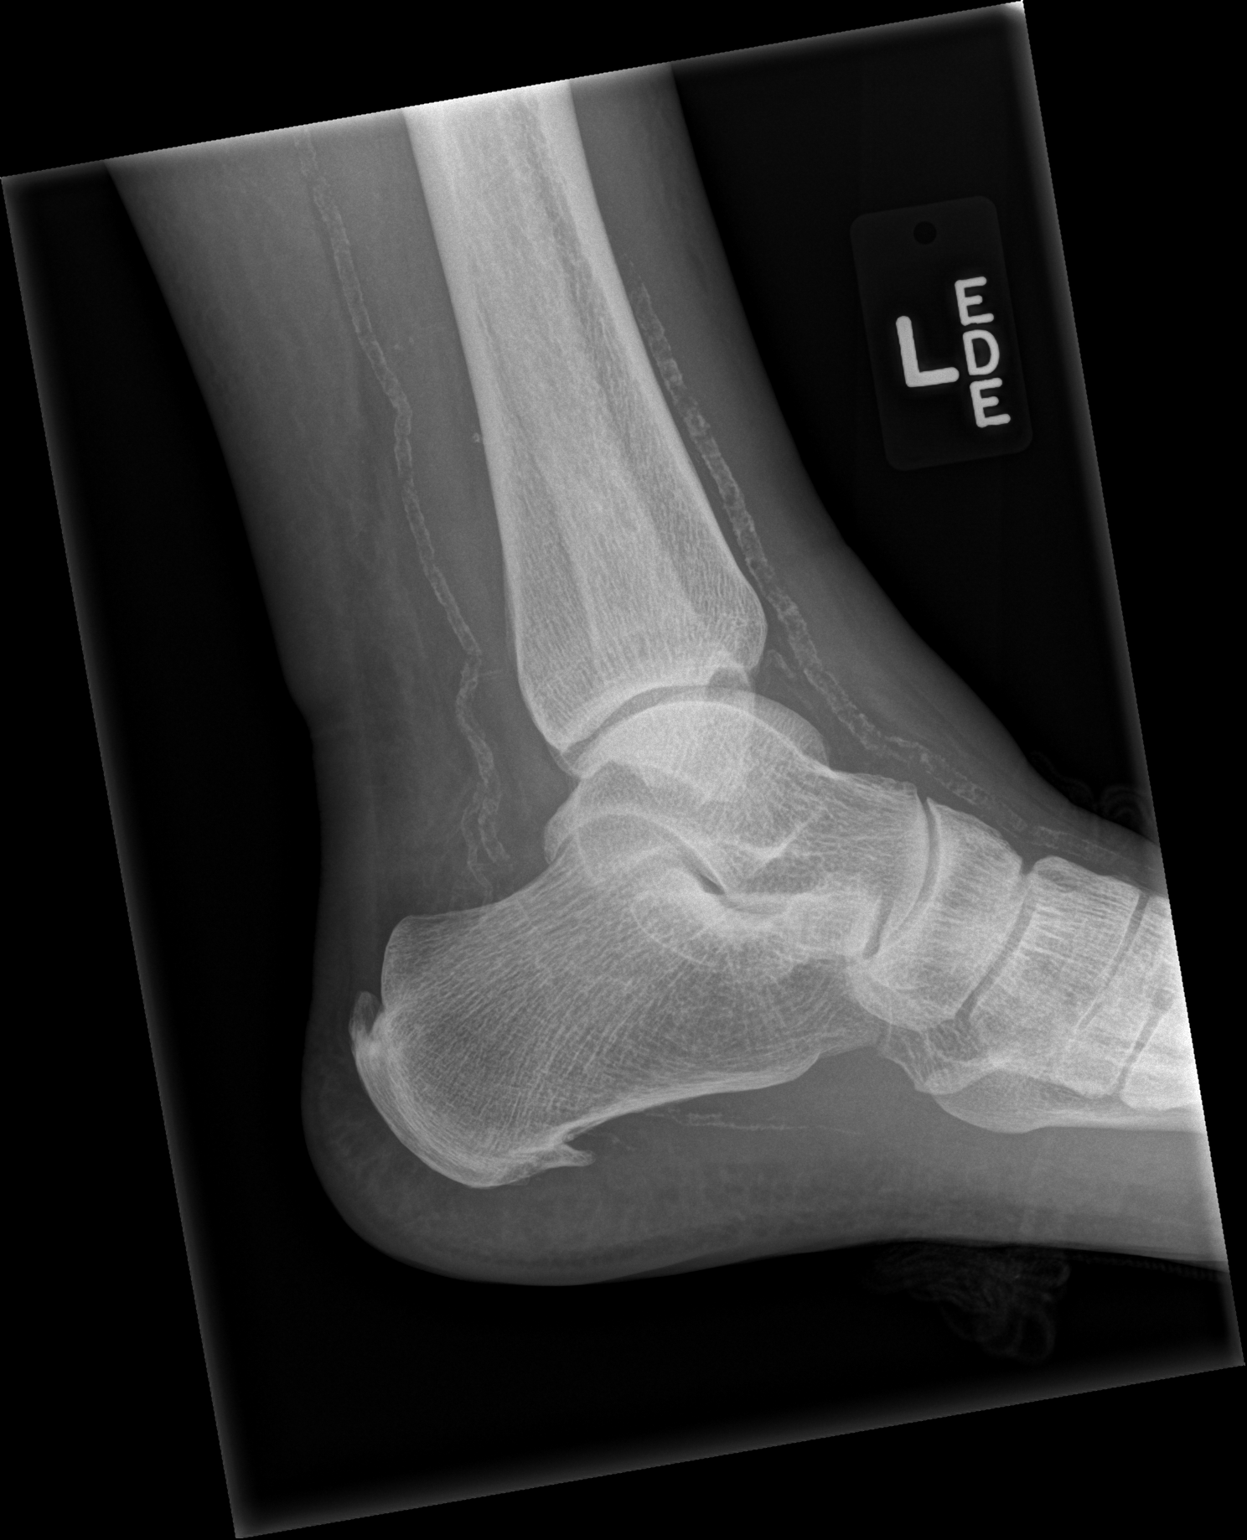

[2 of 2 positions shown; findings below may reference images not displayed]

FINDINGS: Frontal and lateral views were obtained. There is soft tissue
swelling in a generalized manner. There is no demonstrable fracture
or joint effusion. The ankle mortise appears intact. There are spurs
arising from the posterior and inferior calcaneus. There is
extensive arterial vascular calcification.
IMPRESSION: No apparent fracture. Mortise intact. There is soft tissue swelling.

There are calcaneal spurs.

Extensive arterial vascular calcification is seen. This finding most
likely is reflective of diabetes mellitus.

## 2013-11-07 MED ORDER — BETHANECHOL CHLORIDE 10 MG PO TABS
10.0000 mg | ORAL_TABLET | Freq: Three times a day (TID) | ORAL | Status: DC
  Administered 2013-11-07 – 2013-11-08 (×3): 10 mg via ORAL
  Filled 2013-11-07 (×6): qty 1

## 2013-11-07 NOTE — Progress Notes (Addendum)
Physical Therapy Discharge Summary  Patient Details  Name: Anthony Hines MRN: [id number] Date of Birth: [birth year]  Today's Date: 11/07/2013 PT Individual Time: 1500-1600 PT Individual Time Calculation (min): 60 min    Patient has met 10 of 10 long term goals due to improved activity tolerance, improved balance, increased strength, decreased pain, ability to compensate for deficits, improved awareness and improved coordination.  Patient to discharge at an ambulatory level Modified Independent for balance, bed mobility, furniture transfers, ambulation in home environment; supervision for car transfers, stair negotiation and ambulation in community environments. Patient's care partner is independent to provide the necessary physical assistance at discharge.  Reasons goals not met: N/A  Recommendation:  Patient will benefit from ongoing skilled PT services in home health setting to continue to advance safe functional mobility, address ongoing impairments in decreased balance, decreased strength, decreased functional endurance, pain, decreased overall functional mobility and transfers, and minimize fall risk.  Equipment: RW  Reasons for discharge: treatment goals met and discharge from hospital  Patient/family agrees with progress made and goals achieved: Yes  Skilled Therapeutic Interventions 1:1. Pt received sitting in recliner, ready for therapy. Focus this session on functional endurance, shower t/f, functional ambulation and home safety. Pt reporting 3/10 pain in R ankle, reported receiving pain medcine prior to this session. Pt received new pads for TLSO, adjusted and trimmed for proper fit. Pt mod(I) for all ambulation on unit 150-250'x4 bouts and able to safely ambulate in community environment 600'x1 w/ RW and supervision, including on/off elevators, in busy hospital lobby and outside on uneven surfaces. Pt practiced negotiation in/out of shower x2 w/ use of RW by stepping in backwards to  safely sit on shower chair, req supervision w/ min cues for seq and safety. Discussion regarding non-slip mat or strips in tub for increased safety. Pt practiced amb forwards,backwards and side stepping first with RW then with B UE assist by therapist for increased emphasis on WB through B LE. Pt utilized NuStep for B LE strength, level 4x10 min w/ good endurance. Reviewed role of HH therapies and goals for [facility] PT. Pt left sitting in recliner at end of session w/ all needs in reach.   PT Discharge Precautions/Restrictions Precautions Precautions: Back Precaution Comments: Pt able to consistently verbalize 3/3 back precautions Required Braces or Orthoses: Spinal Brace Spinal Brace: Thoracolumbosacral orthotic;Applied in sitting position Restrictions Weight Bearing Restrictions: No Vital Signs Therapy Vitals Temp: 98.4 F (36.9 C) Temp src: Oral Pulse Rate: 97 Resp: 17 BP: 132/76 mmHg Patient Position (if appropriate): Sitting Oxygen Therapy SpO2: 99 % O2 Device: None (Room air) Pain Pain Assessment Pain Assessment: 0-10 Pain Score: 3  Pain Type: Acute pain Pain Location: Ankle Pain Orientation: Left Pain Descriptors / Indicators: Sore Pain Onset: On-going Pain Intervention(s): Distraction;Cutaneous stimulation Vision/Perception  See OT Discharge Cognition Overall Cognitive Status: Within Functional Limits for tasks assessed Attention: Selective Selective Attention: Appears intact Memory: Appears intact Awareness: Appears intact Problem Solving: Appears intact Safety/Judgment: Appears intact Sensation Sensation Light Touch: Appears Intact Proprioception: Appears Intact Coordination Gross Motor Movements are Fluid and Coordinated: Yes Fine Motor Movements are Fluid and Coordinated: Yes Motor  Motor Motor - Discharge Observations: improved pain control, iproved overall strength. TLSO has been readjusted to fit better for more stability. improved alignment and postural  control  Mobility Bed Mobility Bed Mobility: Left Sidelying to Sit;Right Sidelying to Sit;Sit to Sidelying Left;Sit to Sidelying Right;Rolling Left;Rolling Right Rolling Right: 6: Modified independent (Device/Increase time) Rolling Left: 6: Modified independent (Device/Increase  time) Right Sidelying to Sit: 6: Modified independent (Device/Increase time) Left Sidelying to Sit: 6: Modified independent (Device/Increase time) Sit to Sidelying Right: 6: Modified independent (Device/Increase time) Sit to Sidelying Left: 6: Modified independent (Device/Increase time) Transfers Transfers: Yes Sit to Stand: 6: Modified independent (Device/Increase time) Stand to Sit: 6: Modified independent (Device/Increase time) Stand Pivot Transfers: 6: Modified independent (Device/Increase time) Locomotion  Ambulation Ambulation: Yes Ambulation/Gait Assistance: 6: Modified independent (Device/Increase time) Ambulation Distance (Feet): 250 Feet Assistive device: Rolling walker Gait Gait: Yes Gait Pattern: Impaired Gait Pattern: Trunk flexed;Antalgic Gait velocity: decreased Stairs / Additional Locomotion Stairs: Yes Stairs Assistance: 5: Supervision Stair Management Technique: One rail Left;Step to pattern;Sideways Number of Stairs: 15 Curb: 5: Psychiatric nurse: Yes Wheelchair Assistance: 6: Modified independent (Device/Increase time) Environmental health practitioner: Both upper extremities;Both lower extermities Distance: 200'  Trunk/Postural Assessment  Cervical Assessment Cervical Assessment: Within Functional Limits Thoracic Assessment Thoracic Assessment: Exceptions to [facility] Thoracic AROM Overall Thoracic AROM Comments: limited by precautions and TLSO Lumbar Assessment Lumbar Assessment: Exceptions to [facility] Lumbar AROM Overall Lumbar AROM Comments: Limited back precautions, and TLSO Postural Control Postural Control: Within Functional Limits   Balance Balance Balance Assessed: Yes Static Sitting Balance Static Sitting - Balance Support: Feet supported;No upper extremity supported Static Sitting - Level of Assistance: 7: Independent Dynamic Sitting Balance Dynamic Sitting - Balance Support: No upper extremity supported;Left upper extremity supported;Right upper extremity supported;Feet supported Dynamic Sitting - Level of Assistance: 7: Independent Dynamic Sitting - Balance Activities: Lateral lean/weight shifting;Forward lean/weight shifting;Reaching for objects Static Standing Balance Static Standing - Balance Support: Bilateral upper extremity supported;Left upper extremity supported;Right upper extremity supported Static Standing - Level of Assistance: 6: Modified independent (Device/Increase time) Dynamic Standing Balance Dynamic Standing - Balance Support: During functional activity;Right upper extremity supported;Left upper extremity supported;Bilateral upper extremity supported Dynamic Standing - Level of Assistance: 6: Modified independent (Device/Increase time) Extremity Assessment  RUE Assessment RUE Assessment: Within Functional Limits LUE Assessment LUE Assessment: Within Functional Limits RLE Assessment RLE Assessment: Within Functional Limits RLE Strength RLE Overall Strength Comments: Grossly 4+/5, slightly painful during testing LLE Assessment LLE Assessment: Within Functional Limits LLE Strength LLE Overall Strength Comments: Grossly 4+/5, slightly painful during testing  See FIM for current functional status  Gilmore Laroche 11/07/2013, 5:50 PM
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
## 2013-11-07 NOTE — Discharge Instructions (Signed)
Inpatient Rehab Discharge Instructions  Anthony Hines Discharge date and time: No discharge date for patient encounter.   Activities/Precautions/ Functional Status: Activity: activity as tolerated Diet: diabetic diet Wound Care: keep wound clean and dry Functional status:  ___ No restrictions     ___ Walk up steps independently ___ 24/7 supervision/assistance   ___ Walk up steps with assistance ___ Intermittent supervision/assistance  ___ Bathe/dress independently ___ Walk with walker     ___ Bathe/dress with assistance ___ Walk Independently    ___ Shower independently __x_ Walk with assistance    ___ Shower with assistance ___ No alcohol     ___ Return to work/school ________   COMMUNITY REFERRALS UPON DISCHARGE:    Home Health:   PT     OT     RN                   Agency: [facility] Phone: [phone]   Medical Equipment/Items Ordered: rolling walker                                                     Agency/Supplier:  [facility] [phone]    Special Instructions: Back brace when out of bed   My questions have been answered and I understand these instructions. I will adhere to these goals and the provided educational materials after my discharge from the hospital.  Patient/Caregiver Signature _______________________________ Date __________  Clinician Signature _______________________________________ Date __________  Please bring this form and your medication list with you to all your follow-up doctor's appointments.
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
## 2013-11-07 NOTE — Progress Notes (Signed)
Occupational Therapy Discharge Summary  Patient Details  Name: Anthony Hines MRN: [id number] Date of Birth: [birth year]  Today's Date: 11/07/2013 OT Individual Time: [phone] OT Individual Time Calculation (min): 60 min   GRAD DAY and conclusion of family education with wife on self care tasks. Pt able to perform functional ambulation around the room mod I with RW and donned TLSO. Reviewed again with wife how to don TLSO for proper fit with pt sitting EOB. Pt performed toileting and toilet hygiene with tongs - however still required A for getting throughly clean after BM by wife.  Made recommendations for better positioning of toilet paper on tongs.  Pt performed bathing at shower level with TLSO on with long handled sponge as needed. Wife A with bathing bottom in shower for through cleanliness. Returned to EOB to doff brace for RN and wife to perform bandage changes. Demonstrated and had wife return demonstrate on drying and donning new pads on TLSO. Instructions on how to wash pads and air dry given. Pt able to complete dressing with setup. Pt left with wife and RN.   Discharge summary Patient has met 9 of 9 long term goals due to improved activity tolerance, improved balance, postural control, ability to compensate for deficits and improved  pain levels.  Patient to discharge at overall Modified Independent level once TLSO was donned for functional mobility.  Pt still requires A for hygiene after a BM and for bathing bottom at shower or sink level. Pt used adaptive equipment to perform LB bathing and dressing and has tongs to assist with toileting hygiene.    Patient's care partner (wife) is independent to provide the necessary physical assistance at discharge including donning TLSO and A with peri care as needed.  Reasons goals not met: n/a    Equipment: No equipment provided- pt has shower seat and has purchased AE kit.   Reasons for discharge: treatment goals met and discharge from  hospital  Patient/family agrees with progress made and goals achieved: Yes  OT Discharge Precautions/Restrictions  Precautions Precautions: Back Required Braces or Orthoses: Spinal Brace Spinal Brace: Thoracolumbosacral orthotic;Applied in sitting position Restrictions Weight Bearing Restrictions: No General   Vital Signs Therapy Vitals Temp: 98.4 F (36.9 C) Temp src: Oral Pulse Rate: 89 Resp: 18 BP: 137/80 mmHg Patient Position (if appropriate): Lying Oxygen Therapy SpO2: 95 % O2 Device: None (Room air) Pain Pain Assessment Pain Assessment: Faces Pain Score: 3  Faces Pain Scale: Hurts even more Pain Type: Acute pain Pain Location: Ankle Pain Orientation: Left Pain Descriptors / Indicators: Sore Pain Frequency: Intermittent Pain Onset: Progressive Patients Stated Pain Goal: 2 Pain Intervention(s): RN made aware;Rest ADL ADL ADL Comments: see FIM Vision/Perception  Vision- History Baseline Vision/History: Wears glasses Wears Glasses: Reading only Patient Visual Report: No change from baseline Vision- Assessment Vision Assessment?: Yes;No apparent visual deficits  Cognition Overall Cognitive Status: Within Functional Limits for tasks assessed Orientation Level: Oriented X4 Sensation Sensation Light Touch: Appears Intact Hot/Cold: Appears Intact Proprioception: Appears Intact Coordination Gross Motor Movements are Fluid and Coordinated: Yes Fine Motor Movements are Fluid and Coordinated: Yes Motor  Motor Motor - Discharge Observations: improved pain control, iproved overall strength. TLSO has been readjusted to fit better for more stability. improved alignment and postural control Mobility  Bed Mobility Bed Mobility: Sit to Supine Supine to Sit: 6: Modified independent (Device/Increase time) Sit to Supine: 6: Modified independent (Device/Increase time) Transfers Transfers: Sit to Stand;Stand to Sit Sit to Stand: 6: Modified independent  (Device/Increase  time) Stand to Sit: 6: Modified independent (Device/Increase time)  Trunk/Postural Assessment  Cervical Assessment Cervical Assessment: Within Functional Limits Thoracic AROM Overall Thoracic AROM Comments: limited by precautions and TLSO Lumbar AROM Overall Lumbar AROM Comments: Limited back precautions, and TLSO Postural Control Postural Control: Within Functional Limits  Balance Dynamic Sitting Balance Sitting balance - Comments: mod I  Static Standing Balance Static Standing - Level of Assistance: 6: Modified independent (Device/Increase time) Dynamic Standing Balance Dynamic Standing - Balance Support: During functional activity Dynamic Standing - Level of Assistance: 6: Modified independent (Device/Increase time) Extremity/Trunk Assessment RUE Assessment RUE Assessment: Within Functional Limits LUE Assessment LUE Assessment: Within Functional Limits  See FIM for current functional status  Willeen Cass [facility] 11/07/2013, 9:07 AM

## 2013-11-07 NOTE — Progress Notes (Signed)
Physical Therapy Session Note  Patient Details  Name: Anthony Hines MRN: [id number] Date of Birth: January 05, 1956  Today's Date: 11/07/2013 PT Individual Time: 1000-1100 PT Individual Time Calculation (min): 60 min   Short Term Goals: Week 1:  PT Short Term Goal 1 (Week 1): Pt will perform supine<>sit with mod A and min cueing for adherence to spinal precautions. PT Short Term Goal 2 (Week 1): Pt will consistently perform bed<>w/c transfer with min A. PT Short Term Goal 3 (Week 1): Pt will perform gait x30' in controlled environment with min A using LRAD. PT Short Term Goal 4 (Week 1): Pt will effectly demonstrate pressure relief technique without assistance.  Skilled Therapeutic Interventions/Progress Updates:  1:1. Pt received sitting in w/c, ready for therapy. Focus this session on functional endurance, safety during functional mobility/transfers and therex. Pt reporting 6/10 pain in R ankle with WB, RN made aware. Pt mod(I) for w/c propulsion 250'x1, ambulation in controlled/home enviornments 150'x2 and 225'x1 w/ RW, bed mobility and furniture transfers. Pt able to safely negotiate up/down 15 steps w/ B UE on L rail and negotiate in/out of tub w/ RW, min cues for safety. Pt utilized NuStep for B LE strength/endurance, level 4x35min w/ good tolerance. Education provided regarding d/c process and goals for HHPT, pt verbalized understanding. Pt able to verbalize 3/3 back precautions w/out cues. Pt positioned in recliner at end of session w/ B LE elevated due to mild increased swelling noted in B LE. Pt made mod(I) in room, RN aware.    Therapy Documentation Precautions:  Precautions Precautions: Back Precaution Booklet Issued: Yes (comment) Precaution Comments: Pt able to verbalize 2 of 3 spinal precautions. Required Braces or Orthoses: Spinal Brace Spinal Brace: Thoracolumbosacral orthotic;Applied in sitting position Restrictions Weight Bearing Restrictions: No Pain: Pain Assessment Pain  Assessment: 0-10 Pain Score: 6  Faces Pain Scale: Hurts even more Pain Type: Acute pain Pain Location: Ankle Pain Orientation: Left Pain Descriptors / Indicators: Sore Pain Onset: On-going Pain Intervention(s): RN made aware;Distraction;Rest  See FIM for current functional status  Therapy/Group: Individual Therapy  Denzil Hughes 11/07/2013, 10:46 AM

## 2013-11-07 NOTE — Progress Notes (Signed)
Social Work Patient ID: Anthony Hines, male   DOB: 17-Aug-1955, 57 y.o.   MRN: [id number]  Have reviewed team conference with pt and wife.  Both aware and agreeable with target d/c tomorrow with [facility] being arranged.  No concerns. Pleased with progress.  Kinslea Frances, LCSW
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
## 2013-11-07 NOTE — Discharge Summary (Signed)
Discharge summary job [id number]

## 2013-11-07 NOTE — Plan of Care (Signed)
Problem: RH PAIN MANAGEMENT Goal: RH STG PAIN MANAGED AT OR BELOW PT'S PAIN GOAL Less than 2  Outcome: Progressing Oxy IR  q4h prn effective in controlling pain

## 2013-11-07 NOTE — Progress Notes (Signed)
Patient with complaints this morning of pain in (L) ankle with weight-bearing. Patient describes this pain as being  "new" that aches and throbs when placing weight on it. Patient's pain managed with Oxycodone IR every 4 hours prn. Notified Dan A. PA, ordered 2 view of (L) ankle. Will continue to monitor patient. [place]

## 2013-11-08 DIAGNOSIS — S322XXA Fracture of coccyx, initial encounter for closed fracture: Secondary | ICD-10-CM

## 2013-11-08 DIAGNOSIS — I10 Essential (primary) hypertension: Secondary | ICD-10-CM

## 2013-11-08 DIAGNOSIS — S22009A Unspecified fracture of unspecified thoracic vertebra, initial encounter for closed fracture: Secondary | ICD-10-CM

## 2013-11-08 DIAGNOSIS — S3210XA Unspecified fracture of sacrum, initial encounter for closed fracture: Secondary | ICD-10-CM

## 2013-11-08 LAB — GLUCOSE, CAPILLARY: Glucose-Capillary: 145 mg/dL — ABNORMAL HIGH (ref 70–99)

## 2013-11-08 MED ORDER — GABAPENTIN 300 MG PO CAPS: 300.0000 mg | ORAL_CAPSULE | Freq: Two times a day (BID) | ORAL | Status: AC

## 2013-11-08 MED ORDER — AMLODIPINE BESYLATE 5 MG PO TABS: 5.0000 mg | ORAL_TABLET | Freq: Every day | ORAL | Status: AC

## 2013-11-08 MED ORDER — METHOCARBAMOL 500 MG PO TABS: 500.0000 mg | ORAL_TABLET | Freq: Four times a day (QID) | ORAL | Status: AC | PRN

## 2013-11-08 MED ORDER — DSS 100 MG PO CAPS: 100.0000 mg | ORAL_CAPSULE | Freq: Two times a day (BID) | ORAL | Status: AC

## 2013-11-08 MED ORDER — OXYCODONE HCL 5 MG PO TABS: 5.0000 mg | ORAL_TABLET | ORAL | Status: AC | PRN

## 2013-11-08 MED ORDER — GLIMEPIRIDE 2 MG PO TABS: 2.0000 mg | ORAL_TABLET | Freq: Every day | ORAL | Status: AC

## 2013-11-08 MED ORDER — ATORVASTATIN CALCIUM 10 MG PO TABS: 10.0000 mg | ORAL_TABLET | Freq: Every day | ORAL | Status: AC

## 2013-11-08 MED ORDER — SILVER SULFADIAZINE 1 % EX CREA: TOPICAL_CREAM | Freq: Two times a day (BID) | CUTANEOUS | Status: AC
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
## 2013-11-08 NOTE — Progress Notes (Signed)
Patient and family given discharge instructions by Deatra Ina, PA; all questions answered.  Amada Jupiter, SW spoke with patient regarding equipment and home health.  RN offered to teach patient and family how to perform dressing changes to BUE and abdomen; patient refuses stating that a home health nurse will be out to do it.  Patient escorted via wheelchair by Lelon Mast, NT to family's vehicle.

## 2013-11-08 NOTE — Progress Notes (Signed)
Social Work  Discharge Note  The overall goal for the admission was met for:   Discharge location: Yes - home with wife to assist as needed  Length of Stay: Yes - 9 days  Discharge activity level: Yes - modified independent overall  Home/community participation: Yes  Services provided included: MD, RD, PT, OT, RN, TR, Pharmacy and [place]: [employer]  Follow-up services arranged: Home Health: RN, PT, OT via [facility], DME: walker via [place] and Patient/Family has no preference for HH/DME agencies  Comments (or additional information):  Patient/Family verbalized understanding of follow-up arrangements: Yes  Individual responsible for coordination of the follow-up plan: patient  Confirmed correct DME delivered: Anthony Hines 11/08/2013    Anthony Hines

## 2013-11-20 DIAGNOSIS — S90936A Unspecified superficial injury of unspecified lesser toe(s), initial encounter: Secondary | ICD-10-CM | POA: Diagnosis not present

## 2013-11-20 DIAGNOSIS — S50909A Unspecified superficial injury of unspecified elbow, initial encounter: Secondary | ICD-10-CM | POA: Diagnosis not present

## 2013-11-20 DIAGNOSIS — S60919A Unspecified superficial injury of unspecified wrist, initial encounter: Secondary | ICD-10-CM

## 2013-11-20 DIAGNOSIS — S50919A Unspecified superficial injury of unspecified forearm, initial encounter: Secondary | ICD-10-CM

## 2013-11-20 DIAGNOSIS — IMO0002 Reserved for concepts with insufficient information to code with codable children: Secondary | ICD-10-CM | POA: Diagnosis not present

## 2013-11-20 DIAGNOSIS — S7290XD Unspecified fracture of unspecified femur, subsequent encounter for closed fracture with routine healing: Secondary | ICD-10-CM | POA: Diagnosis not present

## 2013-11-20 DIAGNOSIS — S90929A Unspecified superficial injury of unspecified foot, initial encounter: Secondary | ICD-10-CM | POA: Diagnosis not present

## 2019-05-14 ENCOUNTER — Inpatient Hospital Stay
Admission: AD | Admit: 2019-05-14 | Payer: Managed Care, Other (non HMO) | Source: Other Acute Inpatient Hospital | Admitting: Internal Medicine

## 2021-07-08 DIAGNOSIS — M546 Pain in thoracic spine: Secondary | ICD-10-CM | POA: Diagnosis not present

## 2021-07-08 DIAGNOSIS — F1120 Opioid dependence, uncomplicated: Secondary | ICD-10-CM | POA: Diagnosis not present

## 2021-07-08 DIAGNOSIS — Z6835 Body mass index (BMI) 35.0-35.9, adult: Secondary | ICD-10-CM | POA: Diagnosis not present

## 2021-07-08 DIAGNOSIS — M5415 Radiculopathy, thoracolumbar region: Secondary | ICD-10-CM | POA: Diagnosis not present

## 2021-09-28 DIAGNOSIS — M5415 Radiculopathy, thoracolumbar region: Secondary | ICD-10-CM | POA: Diagnosis not present

## 2021-09-28 DIAGNOSIS — F1120 Opioid dependence, uncomplicated: Secondary | ICD-10-CM | POA: Diagnosis not present

## 2021-09-28 DIAGNOSIS — M546 Pain in thoracic spine: Secondary | ICD-10-CM | POA: Diagnosis not present

## 2022-03-01 DIAGNOSIS — M5416 Radiculopathy, lumbar region: Secondary | ICD-10-CM | POA: Diagnosis not present

## 2022-03-01 DIAGNOSIS — M546 Pain in thoracic spine: Secondary | ICD-10-CM | POA: Diagnosis not present

## 2022-03-01 DIAGNOSIS — M5415 Radiculopathy, thoracolumbar region: Secondary | ICD-10-CM | POA: Diagnosis not present

## 2022-03-01 DIAGNOSIS — F1120 Opioid dependence, uncomplicated: Secondary | ICD-10-CM | POA: Diagnosis not present

## 2022-03-01 DIAGNOSIS — Z6836 Body mass index (BMI) 36.0-36.9, adult: Secondary | ICD-10-CM | POA: Diagnosis not present

## 2022-03-29 DIAGNOSIS — M1712 Unilateral primary osteoarthritis, left knee: Secondary | ICD-10-CM | POA: Diagnosis not present

## 2022-03-31 DIAGNOSIS — M546 Pain in thoracic spine: Secondary | ICD-10-CM | POA: Diagnosis not present

## 2022-03-31 DIAGNOSIS — I6930 Unspecified sequelae of cerebral infarction: Secondary | ICD-10-CM | POA: Diagnosis not present

## 2022-03-31 DIAGNOSIS — I129 Hypertensive chronic kidney disease with stage 1 through stage 4 chronic kidney disease, or unspecified chronic kidney disease: Secondary | ICD-10-CM | POA: Diagnosis not present

## 2022-03-31 DIAGNOSIS — M159 Polyosteoarthritis, unspecified: Secondary | ICD-10-CM | POA: Diagnosis not present

## 2022-03-31 DIAGNOSIS — E782 Mixed hyperlipidemia: Secondary | ICD-10-CM | POA: Diagnosis not present

## 2022-03-31 DIAGNOSIS — E1122 Type 2 diabetes mellitus with diabetic chronic kidney disease: Secondary | ICD-10-CM | POA: Diagnosis not present

## 2022-03-31 DIAGNOSIS — I2602 Saddle embolus of pulmonary artery with acute cor pulmonale: Secondary | ICD-10-CM | POA: Diagnosis not present

## 2022-03-31 DIAGNOSIS — Z6836 Body mass index (BMI) 36.0-36.9, adult: Secondary | ICD-10-CM | POA: Diagnosis not present

## 2022-03-31 DIAGNOSIS — I2782 Chronic pulmonary embolism: Secondary | ICD-10-CM | POA: Diagnosis not present

## 2022-03-31 DIAGNOSIS — Z0000 Encounter for general adult medical examination without abnormal findings: Secondary | ICD-10-CM | POA: Diagnosis not present

## 2022-03-31 DIAGNOSIS — N1831 Chronic kidney disease, stage 3a: Secondary | ICD-10-CM | POA: Diagnosis not present

## 2022-04-12 DIAGNOSIS — B351 Tinea unguium: Secondary | ICD-10-CM | POA: Diagnosis not present

## 2022-04-12 DIAGNOSIS — E1143 Type 2 diabetes mellitus with diabetic autonomic (poly)neuropathy: Secondary | ICD-10-CM | POA: Diagnosis not present

## 2022-04-12 DIAGNOSIS — L603 Nail dystrophy: Secondary | ICD-10-CM | POA: Diagnosis not present

## 2022-04-12 DIAGNOSIS — Z7689 Persons encountering health services in other specified circumstances: Secondary | ICD-10-CM | POA: Diagnosis not present

## 2022-04-12 DIAGNOSIS — Z6836 Body mass index (BMI) 36.0-36.9, adult: Secondary | ICD-10-CM | POA: Diagnosis not present

## 2022-05-10 DIAGNOSIS — M5415 Radiculopathy, thoracolumbar region: Secondary | ICD-10-CM | POA: Diagnosis not present

## 2022-05-10 DIAGNOSIS — F1120 Opioid dependence, uncomplicated: Secondary | ICD-10-CM | POA: Diagnosis not present

## 2022-06-21 DIAGNOSIS — I6930 Unspecified sequelae of cerebral infarction: Secondary | ICD-10-CM | POA: Diagnosis not present

## 2022-06-21 DIAGNOSIS — M159 Polyosteoarthritis, unspecified: Secondary | ICD-10-CM | POA: Diagnosis not present

## 2022-06-21 DIAGNOSIS — E1122 Type 2 diabetes mellitus with diabetic chronic kidney disease: Secondary | ICD-10-CM | POA: Diagnosis not present

## 2022-06-21 DIAGNOSIS — E538 Deficiency of other specified B group vitamins: Secondary | ICD-10-CM | POA: Diagnosis not present

## 2022-06-21 DIAGNOSIS — E782 Mixed hyperlipidemia: Secondary | ICD-10-CM | POA: Diagnosis not present

## 2022-06-21 DIAGNOSIS — I2602 Saddle embolus of pulmonary artery with acute cor pulmonale: Secondary | ICD-10-CM | POA: Diagnosis not present

## 2022-06-21 DIAGNOSIS — I129 Hypertensive chronic kidney disease with stage 1 through stage 4 chronic kidney disease, or unspecified chronic kidney disease: Secondary | ICD-10-CM | POA: Diagnosis not present

## 2022-06-21 DIAGNOSIS — F54 Psychological and behavioral factors associated with disorders or diseases classified elsewhere: Secondary | ICD-10-CM | POA: Diagnosis not present

## 2022-06-21 DIAGNOSIS — I2782 Chronic pulmonary embolism: Secondary | ICD-10-CM | POA: Diagnosis not present

## 2022-06-21 DIAGNOSIS — N1831 Chronic kidney disease, stage 3a: Secondary | ICD-10-CM | POA: Diagnosis not present

## 2022-06-21 DIAGNOSIS — G4733 Obstructive sleep apnea (adult) (pediatric): Secondary | ICD-10-CM | POA: Diagnosis not present

## 2022-06-21 DIAGNOSIS — R5382 Chronic fatigue, unspecified: Secondary | ICD-10-CM | POA: Diagnosis not present

## 2022-06-21 DIAGNOSIS — I2510 Atherosclerotic heart disease of native coronary artery without angina pectoris: Secondary | ICD-10-CM | POA: Diagnosis not present

## 2022-07-01 DIAGNOSIS — Z7984 Long term (current) use of oral hypoglycemic drugs: Secondary | ICD-10-CM | POA: Diagnosis not present

## 2022-07-01 DIAGNOSIS — E113291 Type 2 diabetes mellitus with mild nonproliferative diabetic retinopathy without macular edema, right eye: Secondary | ICD-10-CM | POA: Diagnosis not present

## 2022-07-18 DIAGNOSIS — R400 Somnolence: Secondary | ICD-10-CM | POA: Diagnosis not present

## 2022-07-18 DIAGNOSIS — G4733 Obstructive sleep apnea (adult) (pediatric): Secondary | ICD-10-CM | POA: Diagnosis not present

## 2022-07-18 DIAGNOSIS — R5383 Other fatigue: Secondary | ICD-10-CM | POA: Diagnosis not present

## 2022-07-18 DIAGNOSIS — J4520 Mild intermittent asthma, uncomplicated: Secondary | ICD-10-CM | POA: Diagnosis not present

## 2022-07-19 DIAGNOSIS — Z6836 Body mass index (BMI) 36.0-36.9, adult: Secondary | ICD-10-CM | POA: Diagnosis not present

## 2022-07-19 DIAGNOSIS — M722 Plantar fascial fibromatosis: Secondary | ICD-10-CM | POA: Diagnosis not present

## 2022-07-19 DIAGNOSIS — Z7689 Persons encountering health services in other specified circumstances: Secondary | ICD-10-CM | POA: Diagnosis not present

## 2022-07-28 DIAGNOSIS — I2510 Atherosclerotic heart disease of native coronary artery without angina pectoris: Secondary | ICD-10-CM | POA: Diagnosis not present

## 2022-07-28 DIAGNOSIS — R269 Unspecified abnormalities of gait and mobility: Secondary | ICD-10-CM | POA: Diagnosis not present

## 2022-07-28 DIAGNOSIS — N182 Chronic kidney disease, stage 2 (mild): Secondary | ICD-10-CM | POA: Diagnosis not present

## 2022-07-28 DIAGNOSIS — E785 Hyperlipidemia, unspecified: Secondary | ICD-10-CM | POA: Diagnosis not present

## 2022-07-28 DIAGNOSIS — F325 Major depressive disorder, single episode, in full remission: Secondary | ICD-10-CM | POA: Diagnosis not present

## 2022-07-28 DIAGNOSIS — M810 Age-related osteoporosis without current pathological fracture: Secondary | ICD-10-CM | POA: Diagnosis not present

## 2022-07-28 DIAGNOSIS — E1122 Type 2 diabetes mellitus with diabetic chronic kidney disease: Secondary | ICD-10-CM | POA: Diagnosis not present

## 2022-07-28 DIAGNOSIS — M1990 Unspecified osteoarthritis, unspecified site: Secondary | ICD-10-CM | POA: Diagnosis not present

## 2022-07-28 DIAGNOSIS — Z8249 Family history of ischemic heart disease and other diseases of the circulatory system: Secondary | ICD-10-CM | POA: Diagnosis not present

## 2022-07-28 DIAGNOSIS — I129 Hypertensive chronic kidney disease with stage 1 through stage 4 chronic kidney disease, or unspecified chronic kidney disease: Secondary | ICD-10-CM | POA: Diagnosis not present

## 2022-07-28 DIAGNOSIS — E1142 Type 2 diabetes mellitus with diabetic polyneuropathy: Secondary | ICD-10-CM | POA: Diagnosis not present

## 2022-07-28 DIAGNOSIS — Z008 Encounter for other general examination: Secondary | ICD-10-CM | POA: Diagnosis not present

## 2022-08-09 DIAGNOSIS — F1120 Opioid dependence, uncomplicated: Secondary | ICD-10-CM | POA: Diagnosis not present

## 2022-08-09 DIAGNOSIS — M546 Pain in thoracic spine: Secondary | ICD-10-CM | POA: Diagnosis not present

## 2022-08-09 DIAGNOSIS — Z6835 Body mass index (BMI) 35.0-35.9, adult: Secondary | ICD-10-CM | POA: Diagnosis not present

## 2022-08-10 DIAGNOSIS — G4733 Obstructive sleep apnea (adult) (pediatric): Secondary | ICD-10-CM | POA: Diagnosis not present

## 2022-08-16 DIAGNOSIS — R400 Somnolence: Secondary | ICD-10-CM | POA: Diagnosis not present

## 2022-08-16 DIAGNOSIS — J4520 Mild intermittent asthma, uncomplicated: Secondary | ICD-10-CM | POA: Diagnosis not present

## 2022-08-16 DIAGNOSIS — R5383 Other fatigue: Secondary | ICD-10-CM | POA: Diagnosis not present

## 2022-08-16 DIAGNOSIS — G4733 Obstructive sleep apnea (adult) (pediatric): Secondary | ICD-10-CM | POA: Diagnosis not present

## 2022-11-07 DIAGNOSIS — U071 COVID-19: Secondary | ICD-10-CM | POA: Diagnosis not present

## 2022-11-07 DIAGNOSIS — R0981 Nasal congestion: Secondary | ICD-10-CM | POA: Diagnosis not present

## 2022-11-07 DIAGNOSIS — Z6833 Body mass index (BMI) 33.0-33.9, adult: Secondary | ICD-10-CM | POA: Diagnosis not present

## 2022-11-07 DIAGNOSIS — J069 Acute upper respiratory infection, unspecified: Secondary | ICD-10-CM | POA: Diagnosis not present

## 2022-11-17 DIAGNOSIS — R400 Somnolence: Secondary | ICD-10-CM | POA: Diagnosis not present

## 2022-11-17 DIAGNOSIS — R0600 Dyspnea, unspecified: Secondary | ICD-10-CM | POA: Diagnosis not present

## 2022-11-17 DIAGNOSIS — J4540 Moderate persistent asthma, uncomplicated: Secondary | ICD-10-CM | POA: Diagnosis not present

## 2022-11-17 DIAGNOSIS — G4733 Obstructive sleep apnea (adult) (pediatric): Secondary | ICD-10-CM | POA: Diagnosis not present

## 2022-11-18 DIAGNOSIS — R0600 Dyspnea, unspecified: Secondary | ICD-10-CM | POA: Diagnosis not present

## 2022-11-19 DIAGNOSIS — G4733 Obstructive sleep apnea (adult) (pediatric): Secondary | ICD-10-CM | POA: Diagnosis not present

## 2022-11-24 DIAGNOSIS — H524 Presbyopia: Secondary | ICD-10-CM | POA: Diagnosis not present

## 2022-11-24 DIAGNOSIS — F1120 Opioid dependence, uncomplicated: Secondary | ICD-10-CM | POA: Diagnosis not present

## 2022-11-24 DIAGNOSIS — M546 Pain in thoracic spine: Secondary | ICD-10-CM | POA: Diagnosis not present

## 2022-11-24 DIAGNOSIS — H52223 Regular astigmatism, bilateral: Secondary | ICD-10-CM | POA: Diagnosis not present

## 2022-12-01 DIAGNOSIS — R400 Somnolence: Secondary | ICD-10-CM | POA: Diagnosis not present

## 2022-12-01 DIAGNOSIS — J4540 Moderate persistent asthma, uncomplicated: Secondary | ICD-10-CM | POA: Diagnosis not present

## 2022-12-01 DIAGNOSIS — R0600 Dyspnea, unspecified: Secondary | ICD-10-CM | POA: Diagnosis not present

## 2022-12-01 DIAGNOSIS — R051 Acute cough: Secondary | ICD-10-CM | POA: Diagnosis not present

## 2022-12-01 DIAGNOSIS — G4733 Obstructive sleep apnea (adult) (pediatric): Secondary | ICD-10-CM | POA: Diagnosis not present

## 2022-12-08 DIAGNOSIS — R0600 Dyspnea, unspecified: Secondary | ICD-10-CM | POA: Diagnosis not present

## 2022-12-08 DIAGNOSIS — R0602 Shortness of breath: Secondary | ICD-10-CM | POA: Diagnosis not present

## 2022-12-08 DIAGNOSIS — I700 Atherosclerosis of aorta: Secondary | ICD-10-CM | POA: Diagnosis not present

## 2022-12-14 DIAGNOSIS — I10 Essential (primary) hypertension: Secondary | ICD-10-CM | POA: Diagnosis not present

## 2022-12-14 DIAGNOSIS — G4733 Obstructive sleep apnea (adult) (pediatric): Secondary | ICD-10-CM | POA: Diagnosis not present

## 2023-01-13 DIAGNOSIS — I10 Essential (primary) hypertension: Secondary | ICD-10-CM | POA: Diagnosis not present

## 2023-01-13 DIAGNOSIS — G4733 Obstructive sleep apnea (adult) (pediatric): Secondary | ICD-10-CM | POA: Diagnosis not present

## 2023-01-14 DIAGNOSIS — G4733 Obstructive sleep apnea (adult) (pediatric): Secondary | ICD-10-CM | POA: Diagnosis not present

## 2023-01-20 DIAGNOSIS — J4540 Moderate persistent asthma, uncomplicated: Secondary | ICD-10-CM | POA: Diagnosis not present

## 2023-01-20 DIAGNOSIS — R0600 Dyspnea, unspecified: Secondary | ICD-10-CM | POA: Diagnosis not present

## 2023-01-20 DIAGNOSIS — G4733 Obstructive sleep apnea (adult) (pediatric): Secondary | ICD-10-CM | POA: Diagnosis not present

## 2023-01-20 DIAGNOSIS — R400 Somnolence: Secondary | ICD-10-CM | POA: Diagnosis not present

## 2023-01-31 DIAGNOSIS — R0609 Other forms of dyspnea: Secondary | ICD-10-CM | POA: Diagnosis not present

## 2023-01-31 DIAGNOSIS — R079 Chest pain, unspecified: Secondary | ICD-10-CM | POA: Diagnosis not present

## 2023-02-08 DIAGNOSIS — I2510 Atherosclerotic heart disease of native coronary artery without angina pectoris: Secondary | ICD-10-CM | POA: Diagnosis not present

## 2023-02-13 DIAGNOSIS — G4733 Obstructive sleep apnea (adult) (pediatric): Secondary | ICD-10-CM | POA: Diagnosis not present

## 2023-02-13 DIAGNOSIS — I10 Essential (primary) hypertension: Secondary | ICD-10-CM | POA: Diagnosis not present

## 2023-02-14 DIAGNOSIS — J4540 Moderate persistent asthma, uncomplicated: Secondary | ICD-10-CM | POA: Diagnosis not present

## 2023-02-14 DIAGNOSIS — R400 Somnolence: Secondary | ICD-10-CM | POA: Diagnosis not present

## 2023-02-14 DIAGNOSIS — G4733 Obstructive sleep apnea (adult) (pediatric): Secondary | ICD-10-CM | POA: Diagnosis not present

## 2023-02-14 DIAGNOSIS — I2510 Atherosclerotic heart disease of native coronary artery without angina pectoris: Secondary | ICD-10-CM | POA: Diagnosis not present

## 2023-02-27 DIAGNOSIS — F1120 Opioid dependence, uncomplicated: Secondary | ICD-10-CM | POA: Diagnosis not present

## 2023-02-27 DIAGNOSIS — M546 Pain in thoracic spine: Secondary | ICD-10-CM | POA: Diagnosis not present

## 2023-02-27 DIAGNOSIS — M5415 Radiculopathy, thoracolumbar region: Secondary | ICD-10-CM | POA: Diagnosis not present

## 2023-03-15 DIAGNOSIS — I10 Essential (primary) hypertension: Secondary | ICD-10-CM | POA: Diagnosis not present

## 2023-03-15 DIAGNOSIS — G4733 Obstructive sleep apnea (adult) (pediatric): Secondary | ICD-10-CM | POA: Diagnosis not present

## 2023-03-16 DIAGNOSIS — G4733 Obstructive sleep apnea (adult) (pediatric): Secondary | ICD-10-CM | POA: Diagnosis not present

## 2023-03-16 DIAGNOSIS — J4540 Moderate persistent asthma, uncomplicated: Secondary | ICD-10-CM | POA: Diagnosis not present

## 2023-03-30 DIAGNOSIS — E66811 Obesity, class 1: Secondary | ICD-10-CM | POA: Diagnosis not present

## 2023-03-30 DIAGNOSIS — N183 Chronic kidney disease, stage 3 unspecified: Secondary | ICD-10-CM | POA: Diagnosis not present

## 2023-03-30 DIAGNOSIS — E6609 Other obesity due to excess calories: Secondary | ICD-10-CM | POA: Diagnosis not present

## 2023-03-30 DIAGNOSIS — E782 Mixed hyperlipidemia: Secondary | ICD-10-CM | POA: Diagnosis not present

## 2023-03-30 DIAGNOSIS — Z23 Encounter for immunization: Secondary | ICD-10-CM | POA: Diagnosis not present

## 2023-03-30 DIAGNOSIS — E1122 Type 2 diabetes mellitus with diabetic chronic kidney disease: Secondary | ICD-10-CM | POA: Diagnosis not present

## 2023-03-30 DIAGNOSIS — Z794 Long term (current) use of insulin: Secondary | ICD-10-CM | POA: Diagnosis not present

## 2023-03-30 DIAGNOSIS — Z6833 Body mass index (BMI) 33.0-33.9, adult: Secondary | ICD-10-CM | POA: Diagnosis not present

## 2023-03-30 DIAGNOSIS — Z6835 Body mass index (BMI) 35.0-35.9, adult: Secondary | ICD-10-CM | POA: Diagnosis not present

## 2023-04-14 DIAGNOSIS — E1122 Type 2 diabetes mellitus with diabetic chronic kidney disease: Secondary | ICD-10-CM | POA: Diagnosis not present

## 2023-04-14 DIAGNOSIS — I700 Atherosclerosis of aorta: Secondary | ICD-10-CM | POA: Diagnosis not present

## 2023-04-14 DIAGNOSIS — N1831 Chronic kidney disease, stage 3a: Secondary | ICD-10-CM | POA: Diagnosis not present

## 2023-04-14 DIAGNOSIS — E782 Mixed hyperlipidemia: Secondary | ICD-10-CM | POA: Diagnosis not present

## 2023-04-14 DIAGNOSIS — R9439 Abnormal result of other cardiovascular function study: Secondary | ICD-10-CM | POA: Diagnosis not present

## 2023-04-14 DIAGNOSIS — R9389 Abnormal findings on diagnostic imaging of other specified body structures: Secondary | ICD-10-CM | POA: Diagnosis not present

## 2023-04-16 DIAGNOSIS — J4540 Moderate persistent asthma, uncomplicated: Secondary | ICD-10-CM | POA: Diagnosis not present

## 2023-04-16 DIAGNOSIS — G4733 Obstructive sleep apnea (adult) (pediatric): Secondary | ICD-10-CM | POA: Diagnosis not present

## 2023-04-24 DIAGNOSIS — J4540 Moderate persistent asthma, uncomplicated: Secondary | ICD-10-CM | POA: Diagnosis not present

## 2023-04-24 DIAGNOSIS — R400 Somnolence: Secondary | ICD-10-CM | POA: Diagnosis not present

## 2023-04-24 DIAGNOSIS — G4733 Obstructive sleep apnea (adult) (pediatric): Secondary | ICD-10-CM | POA: Diagnosis not present

## 2023-04-24 DIAGNOSIS — I2510 Atherosclerotic heart disease of native coronary artery without angina pectoris: Secondary | ICD-10-CM | POA: Diagnosis not present

## 2023-05-14 DIAGNOSIS — G4733 Obstructive sleep apnea (adult) (pediatric): Secondary | ICD-10-CM | POA: Diagnosis not present

## 2023-05-14 DIAGNOSIS — J4540 Moderate persistent asthma, uncomplicated: Secondary | ICD-10-CM | POA: Diagnosis not present

## 2023-05-24 DIAGNOSIS — N1831 Chronic kidney disease, stage 3a: Secondary | ICD-10-CM | POA: Diagnosis not present

## 2023-05-24 DIAGNOSIS — I2510 Atherosclerotic heart disease of native coronary artery without angina pectoris: Secondary | ICD-10-CM | POA: Diagnosis not present

## 2023-05-24 DIAGNOSIS — R0609 Other forms of dyspnea: Secondary | ICD-10-CM | POA: Diagnosis not present

## 2023-05-24 DIAGNOSIS — E1122 Type 2 diabetes mellitus with diabetic chronic kidney disease: Secondary | ICD-10-CM | POA: Diagnosis not present

## 2023-05-24 DIAGNOSIS — R9439 Abnormal result of other cardiovascular function study: Secondary | ICD-10-CM | POA: Diagnosis not present

## 2023-05-24 DIAGNOSIS — I129 Hypertensive chronic kidney disease with stage 1 through stage 4 chronic kidney disease, or unspecified chronic kidney disease: Secondary | ICD-10-CM | POA: Diagnosis not present

## 2023-05-24 DIAGNOSIS — E782 Mixed hyperlipidemia: Secondary | ICD-10-CM | POA: Diagnosis not present

## 2023-05-30 DIAGNOSIS — M5415 Radiculopathy, thoracolumbar region: Secondary | ICD-10-CM | POA: Diagnosis not present

## 2023-05-30 DIAGNOSIS — F1120 Opioid dependence, uncomplicated: Secondary | ICD-10-CM | POA: Diagnosis not present

## 2023-05-30 DIAGNOSIS — M546 Pain in thoracic spine: Secondary | ICD-10-CM | POA: Diagnosis not present

## 2023-05-31 DIAGNOSIS — R9439 Abnormal result of other cardiovascular function study: Secondary | ICD-10-CM | POA: Diagnosis not present

## 2023-06-07 DIAGNOSIS — R9439 Abnormal result of other cardiovascular function study: Secondary | ICD-10-CM | POA: Diagnosis not present

## 2023-06-07 DIAGNOSIS — I779 Disorder of arteries and arterioles, unspecified: Secondary | ICD-10-CM | POA: Diagnosis not present

## 2023-06-07 DIAGNOSIS — I2510 Atherosclerotic heart disease of native coronary artery without angina pectoris: Secondary | ICD-10-CM | POA: Diagnosis not present

## 2023-06-07 DIAGNOSIS — R0609 Other forms of dyspnea: Secondary | ICD-10-CM | POA: Diagnosis not present

## 2023-06-08 DIAGNOSIS — I2510 Atherosclerotic heart disease of native coronary artery without angina pectoris: Secondary | ICD-10-CM | POA: Diagnosis not present

## 2023-06-08 DIAGNOSIS — I491 Atrial premature depolarization: Secondary | ICD-10-CM | POA: Diagnosis not present

## 2023-06-08 DIAGNOSIS — I4510 Unspecified right bundle-branch block: Secondary | ICD-10-CM | POA: Diagnosis not present

## 2023-06-08 DIAGNOSIS — R9439 Abnormal result of other cardiovascular function study: Secondary | ICD-10-CM | POA: Diagnosis not present

## 2023-06-08 DIAGNOSIS — R0609 Other forms of dyspnea: Secondary | ICD-10-CM | POA: Diagnosis not present

## 2023-06-13 DIAGNOSIS — G4733 Obstructive sleep apnea (adult) (pediatric): Secondary | ICD-10-CM | POA: Diagnosis not present

## 2023-06-13 DIAGNOSIS — I10 Essential (primary) hypertension: Secondary | ICD-10-CM | POA: Diagnosis not present

## 2023-06-14 DIAGNOSIS — G4733 Obstructive sleep apnea (adult) (pediatric): Secondary | ICD-10-CM | POA: Diagnosis not present

## 2023-06-14 DIAGNOSIS — J4540 Moderate persistent asthma, uncomplicated: Secondary | ICD-10-CM | POA: Diagnosis not present

## 2023-06-29 DIAGNOSIS — L578 Other skin changes due to chronic exposure to nonionizing radiation: Secondary | ICD-10-CM | POA: Diagnosis not present

## 2023-06-29 DIAGNOSIS — M62830 Muscle spasm of back: Secondary | ICD-10-CM | POA: Diagnosis not present

## 2023-06-29 DIAGNOSIS — Z6835 Body mass index (BMI) 35.0-35.9, adult: Secondary | ICD-10-CM | POA: Diagnosis not present

## 2023-07-14 DIAGNOSIS — J4540 Moderate persistent asthma, uncomplicated: Secondary | ICD-10-CM | POA: Diagnosis not present

## 2023-07-14 DIAGNOSIS — G4733 Obstructive sleep apnea (adult) (pediatric): Secondary | ICD-10-CM | POA: Diagnosis not present

## 2023-07-17 DIAGNOSIS — G4733 Obstructive sleep apnea (adult) (pediatric): Secondary | ICD-10-CM | POA: Diagnosis not present

## 2023-07-17 DIAGNOSIS — J4540 Moderate persistent asthma, uncomplicated: Secondary | ICD-10-CM | POA: Diagnosis not present

## 2023-07-17 DIAGNOSIS — R400 Somnolence: Secondary | ICD-10-CM | POA: Diagnosis not present

## 2023-07-17 DIAGNOSIS — R0600 Dyspnea, unspecified: Secondary | ICD-10-CM | POA: Diagnosis not present

## 2023-07-28 DIAGNOSIS — E782 Mixed hyperlipidemia: Secondary | ICD-10-CM | POA: Diagnosis not present

## 2023-07-28 DIAGNOSIS — I2510 Atherosclerotic heart disease of native coronary artery without angina pectoris: Secondary | ICD-10-CM | POA: Diagnosis not present

## 2023-07-28 DIAGNOSIS — I10 Essential (primary) hypertension: Secondary | ICD-10-CM | POA: Diagnosis not present

## 2023-08-08 DIAGNOSIS — Z794 Long term (current) use of insulin: Secondary | ICD-10-CM | POA: Diagnosis not present

## 2023-08-08 DIAGNOSIS — B351 Tinea unguium: Secondary | ICD-10-CM | POA: Diagnosis not present

## 2023-08-08 DIAGNOSIS — L603 Nail dystrophy: Secondary | ICD-10-CM | POA: Diagnosis not present

## 2023-08-08 DIAGNOSIS — Z7689 Persons encountering health services in other specified circumstances: Secondary | ICD-10-CM | POA: Diagnosis not present

## 2023-08-08 DIAGNOSIS — Z6835 Body mass index (BMI) 35.0-35.9, adult: Secondary | ICD-10-CM | POA: Diagnosis not present

## 2023-08-08 DIAGNOSIS — E1122 Type 2 diabetes mellitus with diabetic chronic kidney disease: Secondary | ICD-10-CM | POA: Diagnosis not present

## 2023-08-08 DIAGNOSIS — N183 Chronic kidney disease, stage 3 unspecified: Secondary | ICD-10-CM | POA: Diagnosis not present

## 2023-08-14 DIAGNOSIS — J4540 Moderate persistent asthma, uncomplicated: Secondary | ICD-10-CM | POA: Diagnosis not present

## 2023-08-14 DIAGNOSIS — G4733 Obstructive sleep apnea (adult) (pediatric): Secondary | ICD-10-CM | POA: Diagnosis not present

## 2023-08-28 DIAGNOSIS — F1120 Opioid dependence, uncomplicated: Secondary | ICD-10-CM | POA: Diagnosis not present

## 2023-08-28 DIAGNOSIS — M546 Pain in thoracic spine: Secondary | ICD-10-CM | POA: Diagnosis not present

## 2023-08-28 DIAGNOSIS — M5415 Radiculopathy, thoracolumbar region: Secondary | ICD-10-CM | POA: Diagnosis not present

## 2023-09-05 DIAGNOSIS — K029 Dental caries, unspecified: Secondary | ICD-10-CM | POA: Diagnosis not present

## 2023-09-11 DIAGNOSIS — I10 Essential (primary) hypertension: Secondary | ICD-10-CM | POA: Diagnosis not present

## 2023-09-11 DIAGNOSIS — G4733 Obstructive sleep apnea (adult) (pediatric): Secondary | ICD-10-CM | POA: Diagnosis not present

## 2023-09-13 DIAGNOSIS — G4733 Obstructive sleep apnea (adult) (pediatric): Secondary | ICD-10-CM | POA: Diagnosis not present

## 2023-09-13 DIAGNOSIS — J4540 Moderate persistent asthma, uncomplicated: Secondary | ICD-10-CM | POA: Diagnosis not present

## 2023-10-14 DIAGNOSIS — J4540 Moderate persistent asthma, uncomplicated: Secondary | ICD-10-CM | POA: Diagnosis not present
